# Patient Record
Sex: Male | Born: 1950 | Race: White | Hispanic: No | Marital: Married | Smoking: Never smoker
Health system: Southern US, Community
[De-identification: ages and names within clinical notes are randomized; demographics above are authoritative.]

## PROBLEM LIST (undated history)

## (undated) DIAGNOSIS — E119 Type 2 diabetes mellitus without complications: Secondary | ICD-10-CM

## (undated) DIAGNOSIS — I10 Essential (primary) hypertension: Secondary | ICD-10-CM

## (undated) DIAGNOSIS — E663 Overweight: Secondary | ICD-10-CM

## (undated) DIAGNOSIS — R011 Cardiac murmur, unspecified: Secondary | ICD-10-CM

## (undated) DIAGNOSIS — IMO0002 Reserved for concepts with insufficient information to code with codable children: Secondary | ICD-10-CM

## (undated) DIAGNOSIS — E785 Hyperlipidemia, unspecified: Secondary | ICD-10-CM

## (undated) DIAGNOSIS — G629 Polyneuropathy, unspecified: Secondary | ICD-10-CM

## (undated) DIAGNOSIS — R9430 Abnormal result of cardiovascular function study, unspecified: Secondary | ICD-10-CM

## (undated) DIAGNOSIS — E1140 Type 2 diabetes mellitus with diabetic neuropathy, unspecified: Secondary | ICD-10-CM

## (undated) DIAGNOSIS — I4891 Unspecified atrial fibrillation: Secondary | ICD-10-CM

## (undated) HISTORY — DX: Unspecified atrial fibrillation: I48.91

## (undated) HISTORY — DX: Hyperlipidemia, unspecified: E78.5

## (undated) HISTORY — DX: Cardiac murmur, unspecified: R01.1

## (undated) HISTORY — DX: Abnormal result of cardiovascular function study, unspecified: R94.30

## (undated) HISTORY — DX: Reserved for concepts with insufficient information to code with codable children: IMO0002

## (undated) HISTORY — PX: BACK SURGERY: SHX140

## (undated) HISTORY — DX: Overweight: E66.3

## (undated) HISTORY — DX: Essential (primary) hypertension: I10

---

## 2009-08-03 HISTORY — PX: DECOMPRESSIVE LUMBAR LAMINECTOMY LEVEL 2: SHX5792

## 2009-08-20 ENCOUNTER — Ambulatory Visit (HOSPITAL_COMMUNITY): Admission: RE | Admit: 2009-08-20 | Discharge: 2009-08-21 | Payer: Self-pay | Admitting: Neurosurgery

## 2010-03-19 LAB — CBC
HCT: 46.5 % (ref 39.0–52.0)
MCHC: 35.5 g/dL (ref 30.0–36.0)
MCV: 89.4 fL (ref 78.0–100.0)
WBC: 8.7 10*3/uL (ref 4.0–10.5)

## 2010-03-19 LAB — GLUCOSE, CAPILLARY
Glucose-Capillary: 178 mg/dL — ABNORMAL HIGH (ref 70–99)
Glucose-Capillary: 242 mg/dL — ABNORMAL HIGH (ref 70–99)

## 2010-03-19 LAB — SURGICAL PCR SCREEN: MRSA, PCR: NEGATIVE

## 2010-03-19 LAB — BASIC METABOLIC PANEL
Creatinine, Ser: 1.1 mg/dL (ref 0.4–1.5)
GFR calc Af Amer: >60 mL/min (ref >60)
GFR calc non Af Amer: >60 mL/min (ref >60)
Glucose, Bld: 190 mg/dL — ABNORMAL HIGH (ref 70–99)
Potassium: 3.5 mEq/L (ref 3.5–5.1)

## 2010-03-19 LAB — URINALYSIS, ROUTINE W REFLEX MICROSCOPIC
Ketones, ur: NEGATIVE mg/dL
Protein, ur: NEGATIVE mg/dL
Specific Gravity, Urine: 1.034 — ABNORMAL HIGH (ref 1.005–1.030)
pH: 5 (ref 5.0–8.0)

## 2010-03-19 LAB — PROTIME-INR: INR: 0.91 (ref 0.00–1.49)

## 2012-05-13 ENCOUNTER — Encounter: Payer: Self-pay | Admitting: Cardiology

## 2012-05-25 ENCOUNTER — Encounter: Payer: Self-pay | Admitting: Cardiology

## 2012-05-25 ENCOUNTER — Ambulatory Visit (INDEPENDENT_AMBULATORY_CARE_PROVIDER_SITE_OTHER): Payer: BC Managed Care – PPO | Admitting: Cardiology

## 2012-05-25 VITALS — BP 134/89 | HR 114 | Ht 72.0 in | Wt 246.0 lb

## 2012-05-25 DIAGNOSIS — E119 Type 2 diabetes mellitus without complications: Secondary | ICD-10-CM | POA: Insufficient documentation

## 2012-05-25 DIAGNOSIS — I10 Essential (primary) hypertension: Secondary | ICD-10-CM

## 2012-05-25 DIAGNOSIS — E785 Hyperlipidemia, unspecified: Secondary | ICD-10-CM | POA: Insufficient documentation

## 2012-05-25 DIAGNOSIS — I4891 Unspecified atrial fibrillation: Secondary | ICD-10-CM | POA: Insufficient documentation

## 2012-05-25 DIAGNOSIS — E663 Overweight: Secondary | ICD-10-CM | POA: Insufficient documentation

## 2012-05-25 DIAGNOSIS — I441 Atrioventricular block, second degree: Secondary | ICD-10-CM

## 2012-05-25 MED ORDER — DILTIAZEM HCL ER COATED BEADS 240 MG PO CP24: 240.0000 mg | ORAL_CAPSULE | Freq: Every day | ORAL | Status: DC

## 2012-05-25 NOTE — Assessment & Plan Note (Signed)
Blood pressure is controlled. He had noted swelling with 10 mg of amlodipine. He's currently on 5 mg. I will be switching him to diltiazem to help continue to control his blood pressure but also help control his heart rate.

## 2012-05-25 NOTE — Assessment & Plan Note (Signed)
Diabetes is being treated well. No change in therapy.

## 2012-05-25 NOTE — Patient Instructions (Signed)
   Stop Amlodipine  Begin Diltiazem CD 240mg  daily - new sent to pharm  Lab for TSH (thryoid)  Echo  Office will contact with results Continue all other current medications. Follow up in  June 6

## 2012-05-25 NOTE — Assessment & Plan Note (Addendum)
I had a long and complete discussion with the patient about his atrial fibrillation. He understands fully why he needs to be anticoagulated. I explained that we need a better heart rate control. He is already on high-dose metoprolol. Amlodipine will be changed to diltiazem 240 mg For blood pressure control and better rate control. Am hopeful that he will not get edema from this dose of diltiazem. TSH will be checked. Patient will have 2-D echo to rule out occult valvular heart disease. This will be done next week when his heart rate is under better control. I will then see him back for followup on June 6. If he remains in atrial fibrillation we will proceed with outpatient cardioversion soon after that.  I had an extensive discussion with the patient concerning atrial fibrillation in the approach will be taking.

## 2012-05-25 NOTE — Progress Notes (Signed)
HPI  The patient is referred for new patient cardiac evaluation for atrial fibrillation. I have records from Dr. Dian Situ office stating that the patient had atrial fibrillation that was noted during an office visit on May 17, 2012. The patient was not aware of his atrial fib at that time. We really do not know how long he has had this. He says that he may not have been as active during the winter. He does not have any marked palpitations. His atrial fibrillation rate is elevated. He was started on Eliquis on May 16. I have labs that were done recently. His renal function is normal. I do not see a TSH.  The patient does have risk factors including diabetes and hypertension.  No Known Allergies  Current Outpatient Prescriptions  Medication Sig Dispense Refill  . amLODipine (NORVASC) 10 MG tablet Take 10 mg by mouth daily.      Marland Kitchen apixaban (ELIQUIS) 5 MG TABS tablet Take 5 mg by mouth 2 (two) times daily.      Marland Kitchen aspirin 81 MG tablet Take 81 mg by mouth daily. On hold as of  May 25, 2012      . atorvastatin (LIPITOR) 10 MG tablet Take by mouth. 0.5 Tablets 3 days a week      . lisinopril-hydrochlorothiazide (PRINZIDE,ZESTORETIC) 20-12.5 MG per tablet Take 1 tablet by mouth daily.      . metFORMIN (GLUCOPHAGE) 500 MG tablet Take 500 mg by mouth 3 (three) times daily.      . metoprolol (TOPROL-XL) 200 MG 24 hr tablet Take 200 mg by mouth daily.      Marland Kitchen omeprazole (PRILOSEC) 20 MG capsule Take 20 mg by mouth daily.      Marland Kitchen zolpidem (AMBIEN) 10 MG tablet Take 10 mg by mouth at bedtime as needed for sleep.      . cyclobenzaprine (FLEXERIL) 10 MG tablet Take 10 mg by mouth as needed for muscle spasms.       No current facility-administered medications for this visit.    History   Social History  . Marital Status: Married    Spouse Name: N/A    Number of Children: N/A  . Years of Education: N/A   Occupational History  . Not on file.   Social History Main Topics  . Smoking status: Never  Smoker   . Smokeless tobacco: Not on file  . Alcohol Use: Not on file  . Drug Use: Not on file  . Sexually Active: Not on file   Other Topics Concern  . Not on file   Social History Narrative  . No narrative on file    No family history on file.  Past Medical History  Diagnosis Date  . Diabetes mellitus   . Dyslipidemia   . Hypertension   . Overweight   . Atrial fibrillation   . Heart murmur     As a Child  . AV block, 2nd degree     February, 2014    Past Surgical History  Procedure Laterality Date  . Back surgery  2011/08    Patient Active Problem List   Diagnosis Date Noted  . Diabetes mellitus   . Dyslipidemia   . Hypertension   . Overweight   . Atrial fibrillation   . AV block, 2nd degree     ROS   Patient denies fever, chills, headache, sweats, rash, change in vision, change in hearing, chest pain, cough, nausea vomiting, urinary symptoms. All other systems are reviewed and  are negative.  PHYSICAL EXAM  Patient is oriented to person time and place. Affect is normal. There is no jugulovenous distention. Lungs are clear. Respiratory effort is nonlabored. Cardiac exam reveals S1 and S2. The rhythm is irregularly irregular. There no significant murmurs. The abdomen is soft. There is no peripheral edema. There no musculoskeletal deformities. There no skin rashes.  Filed Vitals:   05/25/12 1251  BP: 134/89  Pulse: 114  Height: 6' (1.829 m)  Weight: 246 lb (111.585 kg)    EKG is done today and reviewed by me. He has atrial fibrillation. The rate is rapid. The QRS is normal. Compared to his outpatient recent tracing and there is no change. ASSESSMENT & PLAN

## 2012-05-31 ENCOUNTER — Other Ambulatory Visit (INDEPENDENT_AMBULATORY_CARE_PROVIDER_SITE_OTHER): Payer: BC Managed Care – PPO

## 2012-05-31 ENCOUNTER — Other Ambulatory Visit: Payer: Self-pay

## 2012-05-31 DIAGNOSIS — I472 Ventricular tachycardia: Secondary | ICD-10-CM

## 2012-05-31 DIAGNOSIS — I4891 Unspecified atrial fibrillation: Secondary | ICD-10-CM

## 2012-06-01 ENCOUNTER — Encounter: Payer: Self-pay | Admitting: Cardiology

## 2012-06-01 DIAGNOSIS — R9430 Abnormal result of cardiovascular function study, unspecified: Secondary | ICD-10-CM | POA: Insufficient documentation

## 2012-06-05 ENCOUNTER — Encounter: Payer: Self-pay | Admitting: *Deleted

## 2012-06-05 ENCOUNTER — Telehealth: Payer: Self-pay | Admitting: *Deleted

## 2012-06-05 NOTE — Telephone Encounter (Signed)
Patient informed. 

## 2012-06-05 NOTE — Telephone Encounter (Signed)
Message copied by Eustace Moore on Tue Jun 05, 2012 11:18 AM ------      Message from: Myrtis Ser, [place] D      Created: Fri Jun 01, 2012  2:51 PM       Please let the patient know that the echo result is good. ------

## 2012-06-08 ENCOUNTER — Ambulatory Visit (INDEPENDENT_AMBULATORY_CARE_PROVIDER_SITE_OTHER): Payer: BC Managed Care – PPO | Admitting: Cardiology

## 2012-06-08 ENCOUNTER — Encounter: Payer: Self-pay | Admitting: Cardiology

## 2012-06-08 VITALS — BP 145/86 | HR 86 | Ht 72.0 in | Wt 249.0 lb

## 2012-06-08 DIAGNOSIS — I10 Essential (primary) hypertension: Secondary | ICD-10-CM

## 2012-06-08 DIAGNOSIS — I4891 Unspecified atrial fibrillation: Secondary | ICD-10-CM

## 2012-06-08 DIAGNOSIS — R0989 Other specified symptoms and signs involving the circulatory and respiratory systems: Secondary | ICD-10-CM

## 2012-06-08 DIAGNOSIS — R9430 Abnormal result of cardiovascular function study, unspecified: Secondary | ICD-10-CM

## 2012-06-08 NOTE — Patient Instructions (Addendum)
   Please contact our office on Monday with your plans about getting a cardioversion. Your physician recommends that you continue on your current medications as directed. Please refer to the Current Medication list given to you today.

## 2012-06-08 NOTE — Assessment & Plan Note (Addendum)
He has persistent atrial fibrillation. The rate is controlled. He is carefully anticoagulated. I have recommended cardioversion. He now says he first wants to talk to his wife and brother. He then wants to decide if he once the procedure at [facility] ordered [place]. He will let us know more information on Monday, June 9.  I had a very extensive discussion with the patient. I reviewed with him the rationale for proceeding with cardioversion. I talked with him about other drugs. I explained to him that he was not a candidate at this time for atrial fibrillation. I talked with him about risks of the procedure. I talked with him about  The fact that I was comfortable with [facility] hospital or [place] for his procedure.  As part of this evaluation I spent greater than 25 minutes with the patient today. More than half of this time was spent with direct discussion with him about the procedure.

## 2012-06-08 NOTE — Assessment & Plan Note (Signed)
Blood pressures controlled. No change in therapy. 

## 2012-06-08 NOTE — Progress Notes (Signed)
HPI   The patient is seen for followup atrial fibrillation. I saw him as a new patient on May 25, 2012. He had recently diagnosed atrial fibrillation. He does not since his atrial fibrillation. We really do not know how long he has had atrial fibrillation. He has been on Eliquis since May 17, 2012. I switched his amlodipine to diltiazem. He has better rate control. Two-dimensional echo was done revealing an ejection fraction 55-60% with no significant valvular abnormalities. He is now back for followup.  No Known Allergies  Current Outpatient Prescriptions  Medication Sig Dispense Refill  . apixaban (ELIQUIS) 5 MG TABS tablet Take 5 mg by mouth 2 (two) times daily.      Marland Kitchen aspirin 81 MG tablet Take 81 mg by mouth daily. On hold as of  May 25, 2012      . atorvastatin (LIPITOR) 10 MG tablet Take by mouth. 0.5 Tablets 3 days a week      . cyclobenzaprine (FLEXERIL) 10 MG tablet Take 10 mg by mouth as needed for muscle spasms.      Marland Kitchen diltiazem (CARDIZEM CD) 240 MG 24 hr capsule Take 1 capsule (240 mg total) by mouth daily.  30 capsule  6  . lisinopril-hydrochlorothiazide (PRINZIDE,ZESTORETIC) 20-12.5 MG per tablet Take 1 tablet by mouth daily.      . metFORMIN (GLUCOPHAGE) 500 MG tablet Take 500 mg by mouth 3 (three) times daily.      . metoprolol (TOPROL-XL) 200 MG 24 hr tablet Take 200 mg by mouth daily.      Marland Kitchen omeprazole (PRILOSEC) 20 MG capsule Take 20 mg by mouth daily.      Marland Kitchen zolpidem (AMBIEN) 10 MG tablet Take 10 mg by mouth at bedtime as needed for sleep.       No current facility-administered medications for this visit.    History   Social History  . Marital Status: Married    Spouse Name: N/A    Number of Children: N/A  . Years of Education: N/A   Occupational History  . Not on file.   Social History Main Topics  . Smoking status: Never Smoker   . Smokeless tobacco: Never Used  . Alcohol Use: Not on file  . Drug Use: Not on file  . Sexually Active: Not on file   Other  Topics Concern  . Not on file   Social History Narrative  . No narrative on file    No family history on file.  Past Medical History  Diagnosis Date  . Diabetes mellitus   . Dyslipidemia   . Hypertension   . Overweight(278.02)   . Atrial fibrillation   . Heart murmur     As a Child  . Ejection fraction     EF 55-60%, echo, May, 2014    Past Surgical History  Procedure Laterality Date  . Back surgery  2011/08    Patient Active Problem List   Diagnosis Date Noted  . Ejection fraction   . Diabetes mellitus   . Dyslipidemia   . Hypertension   . Overweight(278.02)   . Atrial fibrillation     ROS   Patient denies fever, chills, headache, sweats, rash, change in vision, change in hearing, chest pain, cough, nausea or vomiting, urinary symptoms. All other systems are reviewed and are negative.  PHYSICAL EXAM  Patient is oriented to person time and place. Affect is normal. Lungs are clear. Respiratory effort is nonlabored. Cardiac exam reveals S1 and S2. There no  clicks or significant murmurs. The abdomen is soft. There is no peripheral edema.  Filed Vitals:   06/08/12 1457  BP: 145/86  Pulse: 86  Height: 6' (1.829 m)  Weight: 249 lb (112.946 kg)   EKG is done today and reviewed by me. He continues to have atrial fibrillation. This is coarse atrial fibrillation. At times atrial flutter must be considered.  ASSESSMENT & PLAN

## 2012-06-08 NOTE — Assessment & Plan Note (Signed)
His echo shows a normal ejection fraction and no significant valvular abnormalities. There was mild dilatation of the left atrium.

## 2012-06-11 ENCOUNTER — Telehealth: Payer: Self-pay | Admitting: *Deleted

## 2012-06-11 ENCOUNTER — Encounter: Payer: Self-pay | Admitting: *Deleted

## 2012-06-11 ENCOUNTER — Telehealth: Payer: Self-pay | Admitting: Cardiology

## 2012-06-11 ENCOUNTER — Other Ambulatory Visit: Payer: Self-pay | Admitting: *Deleted

## 2012-06-11 DIAGNOSIS — I4891 Unspecified atrial fibrillation: Secondary | ICD-10-CM

## 2012-06-11 NOTE — Telephone Encounter (Signed)
DCCV SCHEDULED FOR Thursday June 14, 2012 AT 9:00 AM WITH DR. KATZ PATIENT TO ARRIVE AT 7:00 AM [facility]

## 2012-06-11 NOTE — Telephone Encounter (Signed)
Patient informed of date and time for DCCV.

## 2012-06-11 NOTE — Telephone Encounter (Signed)
No precert required 

## 2012-06-11 NOTE — Addendum Note (Signed)
Addended by: Eustace Moore on: 06/11/2012 04:00 PM   Modules accepted: Orders

## 2012-06-14 ENCOUNTER — Encounter: Payer: Self-pay | Admitting: Cardiology

## 2012-06-19 ENCOUNTER — Encounter: Payer: Self-pay | Admitting: Cardiology

## 2012-06-19 NOTE — Progress Notes (Signed)
   After I saw the patient on June 08, 2012 we arrange for an outpatient cardioversion. He came to [facility]. When he arrived he was in sinus rhythm. I had a careful discussion with him and his wife. He was discharged home. I asked him to remain on Xarelto. We will see him back in the office for followup. I will then decide about the longevity of the Xarelto therapy.  Jerral Bonito, MD

## 2012-08-06 ENCOUNTER — Encounter: Payer: Self-pay | Admitting: Cardiology

## 2012-08-08 ENCOUNTER — Encounter: Payer: Self-pay | Admitting: Cardiology

## 2012-08-08 ENCOUNTER — Encounter: Payer: Self-pay | Admitting: *Deleted

## 2012-08-08 ENCOUNTER — Telehealth: Payer: Self-pay | Admitting: Cardiology

## 2012-08-08 ENCOUNTER — Ambulatory Visit (INDEPENDENT_AMBULATORY_CARE_PROVIDER_SITE_OTHER): Payer: BC Managed Care – PPO | Admitting: Cardiology

## 2012-08-08 VITALS — BP 136/95 | HR 108 | Ht 72.0 in | Wt 243.0 lb

## 2012-08-08 DIAGNOSIS — I10 Essential (primary) hypertension: Secondary | ICD-10-CM

## 2012-08-08 DIAGNOSIS — Z5181 Encounter for therapeutic drug level monitoring: Secondary | ICD-10-CM

## 2012-08-08 DIAGNOSIS — I25810 Atherosclerosis of coronary artery bypass graft(s) without angina pectoris: Secondary | ICD-10-CM

## 2012-08-08 DIAGNOSIS — I4891 Unspecified atrial fibrillation: Secondary | ICD-10-CM

## 2012-08-08 DIAGNOSIS — Z79899 Other long term (current) drug therapy: Secondary | ICD-10-CM

## 2012-08-08 NOTE — Telephone Encounter (Signed)
Nathan Sutton is concerned about a dental cleaning scheduled for tomorrow. He wants to know will it be ok for dental cleaning And taking the medication Eliquis 5 mg.

## 2012-08-08 NOTE — Telephone Encounter (Signed)
Auth # [id number] exp 09/06/12

## 2012-08-08 NOTE — Assessment & Plan Note (Signed)
Blood pressures control today. He is anxious. No change in therapy.

## 2012-08-08 NOTE — Assessment & Plan Note (Signed)
The patient has return of atrial fibrillation. He does not feel it. It is clear that he goes in and out of atrial fibrillation. He continues on Eliquis (previously I had written Xarelto in the chart).  I want to try to get him back into sinus rhythm long-term. I feel he is a good candidate for flecainide therapy. I know that he is normal left trigger function. He has not had any type of stress testing however. Pharmacologic nuclear study will be done. Assuming this is normal I will start him on flecainide and see him for early followup.  As part of today's evaluation I spent greater than 25 minutes with the patient's overall care. More than half of this time was spent with direct contact with him. I had a long and careful discussion with him about the rationale of the studies to be done for his atrial fibrillation and the plan.

## 2012-08-08 NOTE — Telephone Encounter (Signed)
lexiscan myoview scheduled for 08-15-12 @ [employer] office. Checking percert

## 2012-08-08 NOTE — Progress Notes (Signed)
HPI  The patient is seen back to followup atrial fibrillation. I had first seen him on June 08, 2012 for atrial fibrillation. This was diagnosed by his primary care team and he was already on anticoagulation. Two-dimensional echo showed an ejection fraction 55-60%. There were no focal wall motion abnormalities. There were no significant valvular abnormalities. I continued his anticoagulation and arrange for cardioversion to be done on June 19, 2012. When he arrived he was definitely in sinus rhythm. He does not feel his arrhythmia when he is in atrial fibrillation. I am now seeing him for followup. Anticoagulation continues.  EKG in the office today does show atrial fibrillation. He thought that his rate might be irregular but he did not feel poorly. He does have some anxiety about coming to the office. Heart rate is 95. He's not having any shortness of breath.   Current Outpatient Prescriptions  Medication Sig Dispense Refill  . apixaban (ELIQUIS) 5 MG TABS tablet Take 5 mg by mouth 2 (two) times daily.      Marland Kitchen aspirin 81 MG tablet Take 81 mg by mouth daily. On hold as of  May 25, 2012      . atorvastatin (LIPITOR) 10 MG tablet Take by mouth. 0.5 Tablets 3 days a week      . cyclobenzaprine (FLEXERIL) 10 MG tablet Take 10 mg by mouth as needed for muscle spasms.      Marland Kitchen diltiazem (CARDIZEM CD) 240 MG 24 hr capsule Take 1 capsule (240 mg total) by mouth daily.  30 capsule  6  . lisinopril-hydrochlorothiazide (PRINZIDE,ZESTORETIC) 20-12.5 MG per tablet Take 1 tablet by mouth daily.      . metFORMIN (GLUCOPHAGE) 500 MG tablet Take 500 mg by mouth 3 (three) times daily.      . metoprolol (TOPROL-XL) 200 MG 24 hr tablet Take 200 mg by mouth daily.      Marland Kitchen omeprazole (PRILOSEC) 20 MG capsule Take 20 mg by mouth daily.      Marland Kitchen zolpidem (AMBIEN) 10 MG tablet Take 10 mg by mouth at bedtime as needed for sleep.       No current facility-administered medications for this visit.    History   Social  History  . Marital Status: Married    Spouse Name: N/A    Number of Children: N/A  . Years of Education: N/A   Occupational History  . Not on file.   Social History Main Topics  . Smoking status: Never Smoker   . Smokeless tobacco: Never Used  . Alcohol Use: Not on file  . Drug Use: Not on file  . Sexually Active: Not on file   Other Topics Concern  . Not on file   Social History Narrative  . No narrative on file    No family history on file.  Past Medical History  Diagnosis Date  . Diabetes mellitus   . Dyslipidemia   . Hypertension   . Overweight(278.02)   . Atrial fibrillation   . Heart murmur     As a Child  . Ejection fraction     EF 55-60%, echo, May, 2014    Past Surgical History  Procedure Laterality Date  . Back surgery  2011/08    Patient Active Problem List   Diagnosis Date Noted  . Ejection fraction   . Diabetes mellitus   . Dyslipidemia   . Hypertension   . Overweight(278.02)   . Atrial fibrillation     ROS   patient denies  fever, chills, headache, sweats, rash, change in vision, change in hearing, chest pain, cough, nausea vomiting, urinary symptoms. All other systems are reviewed and are negative.  PHYSICAL EXAM  Patient is stable. He is oriented to person time and place. Affect is normal. There is no jugulovenous distention. Lungs are clear. Respiratory effort is nonlabored. Cardiac exam reveals S1 and S2. There no clicks or significant murmurs. The abdomen is soft. There is no peripheral edema. There no musculoskeletal deformities. There no skin rashes.  Filed Vitals:   08/08/12 1032  BP: 136/95  Pulse: 108  Height: 6' (1.829 m)  Weight: 243 lb (110.224 kg)   EKG is done today and reviewed by me. He has atrial fibrillation with a rate of 95.   ASSESSMENT & PLAN

## 2012-08-08 NOTE — Patient Instructions (Addendum)
Your physician recommends that you schedule a follow-up appointment in: 3-4 weeks. Your physician recommends that you continue on your current medications as directed. Please refer to the Current Medication list given to you today. Your physician has requested that you have a lexiscan myoview. For further information please visit [email or web address] Please follow instruction sheet, as given. Depending on your stress test results, your doctor will decide about you starting flecainide.

## 2012-08-15 ENCOUNTER — Ambulatory Visit (HOSPITAL_COMMUNITY): Payer: BC Managed Care – PPO | Attending: Cardiology | Admitting: Radiology

## 2012-08-15 VITALS — BP 134/94 | Ht 72.0 in | Wt 248.0 lb

## 2012-08-15 DIAGNOSIS — E119 Type 2 diabetes mellitus without complications: Secondary | ICD-10-CM | POA: Insufficient documentation

## 2012-08-15 DIAGNOSIS — I25810 Atherosclerosis of coronary artery bypass graft(s) without angina pectoris: Secondary | ICD-10-CM

## 2012-08-15 DIAGNOSIS — I4892 Unspecified atrial flutter: Secondary | ICD-10-CM | POA: Insufficient documentation

## 2012-08-15 DIAGNOSIS — I10 Essential (primary) hypertension: Secondary | ICD-10-CM | POA: Insufficient documentation

## 2012-08-15 DIAGNOSIS — I4891 Unspecified atrial fibrillation: Secondary | ICD-10-CM | POA: Insufficient documentation

## 2012-08-15 DIAGNOSIS — Z79899 Other long term (current) drug therapy: Secondary | ICD-10-CM

## 2012-08-15 MED ORDER — REGADENOSON 0.4 MG/5ML IV SOLN
0.4000 mg | Freq: Once | INTRAVENOUS | Status: AC
  Administered 2012-08-15: 0.4 mg via INTRAVENOUS

## 2012-08-15 MED ORDER — METOPROLOL TARTRATE 1 MG/ML IV SOLN
5.0000 mg | Freq: Once | INTRAVENOUS | Status: AC
  Administered 2012-08-15: 5 mg via INTRAVENOUS

## 2012-08-15 MED ORDER — TECHNETIUM TC 99M SESTAMIBI GENERIC - CARDIOLITE
11.0000 | Freq: Once | INTRAVENOUS | Status: AC | PRN
  Administered 2012-08-15: 11 via INTRAVENOUS

## 2012-08-15 MED ORDER — TECHNETIUM TC 99M SESTAMIBI GENERIC - CARDIOLITE
33.0000 | Freq: Once | INTRAVENOUS | Status: AC | PRN
  Administered 2012-08-15: 33 via INTRAVENOUS

## 2012-08-15 NOTE — Progress Notes (Signed)
[facility] SITE 3 NUCLEAR MED [address] [place], [place] [address] [phone]    Cardiology Nuclear Med Study  Nathan Sutton is a 62 y.o. male     MRN : [id number]     DOB: 1950/08/19  Procedure Date: 08/15/2012  Nuclear Med Background Indication for Stress Test:  Evaluation for Ischemia, evaluation prior to starting Flecainide History:  2014 Newly diagnosed A-fib, Echo EF 55-60% Cardiac Risk Factors: Hypertension, Lipids and NIDDM  Symptoms:  No Cardiac Symptoms   Nuclear Pre-Procedure Caffeine/Decaff Intake:  None NPO After: 11:30pm   Lungs:  clear O2 Sat: 98% on room air. IV 0.9% NS with Angio Cath:  22g  IV Site: R Antecubital  IV Started by:  Bonnita Levan, RN  Chest Size (in):  48 Cup Size: n/a  Height: 6' (1.829 m)  Weight:  248 lb (112.492 kg)  BMI:  Body mass index is 33.63 kg/(m^2). Tech Comments:  Patient held Metformin, Toprol this AM, Heart rate prior to test 140's-150's. 200 mg Toprol po and 5mg  toprol IV given per Dr. Excell Seltzer.    Nuclear Med Study 1 or 2 day study: 1 day  Stress Test Type:  Lexiscan  Reading MD: Charlton Haws, MD  Order Authorizing Provider:  Willa Rough, MD  Resting Radionuclide: Technetium 59m Sestamibi  Resting Radionuclide Dose: 11.0 mCi   Stress Radionuclide:  Technetium 21m Sestamibi  Stress Radionuclide Dose: 33.0 mCi           Stress Protocol Rest HR: 109 Stress HR: 118  Rest BP: 134/94 Stress BP: 163/101  Exercise Time (min): n/a METS: n/a   Predicted Max HR: 159 bpm % Max HR: 78.62 bpm Rate Pressure Product: 72536   Dose of Adenosine (mg):  n/a Dose of Lexiscan: 0.4 mg  Dose of Atropine (mg): n/a Dose of Dobutamine: n/a mcg/kg/min (at max HR)  Stress Test Technologist: Bonnita Levan, RN  Nuclear Technologist:  Domenic Polite, CNMT     Rest Procedure:  Myocardial perfusion imaging was performed at rest 45 minutes following the intravenous administration of Technetium 57m Sestamibi. Rest ECG: Atrial  Fibrilliation  Stress Procedure:  The patient received IV Lexiscan 0.4 mg over 15-seconds.  Technetium 7m Sestamibi injected at 30-seconds.  Quantitative spect images were obtained after a 45 minute delay. Stress ECG: No significant change from baseline ECG  QPS Raw Data Images:  Normal; no motion artifact; normal heart/lung ratio. Stress Images:  Normal homogeneous uptake in all areas of the myocardium. Rest Images:  Normal homogeneous uptake in all areas of the myocardium. Subtraction (SDS):  Normal Transient Ischemic Dilatation (Normal <1.22):  n/a Lung/Heart Ratio (Normal <0.45):  0.45  Quantitative Gated Spect Images QGS EDV:  n/a QGS ESV:  n/a  Impression Exercise Capacity:  Lexiscan with no exercise. BP Response:  Normal blood pressure response. Clinical Symptoms:  No significant symptoms noted. ECG Impression:  No significant ST segment change suggestive of ischemia. Comparison with Prior Nuclear Study: No previous nuclear study performed  Overall Impression:  Normal stress nuclear study. Baseline rhythm rapid fib/flutter  LV Ejection Fraction: Study not gated.  LV Wall Motion:  Study not gated  [employer]

## 2012-08-16 ENCOUNTER — Encounter: Payer: Self-pay | Admitting: Cardiology

## 2012-08-16 ENCOUNTER — Telehealth: Payer: Self-pay | Admitting: *Deleted

## 2012-08-16 DIAGNOSIS — IMO0001 Reserved for inherently not codable concepts without codable children: Secondary | ICD-10-CM | POA: Insufficient documentation

## 2012-08-16 MED ORDER — FLECAINIDE ACETATE 50 MG PO TABS: 50.0000 mg | ORAL_TABLET | Freq: Two times a day (BID) | ORAL | Status: DC

## 2012-08-16 NOTE — Telephone Encounter (Signed)
Message copied by Eustace Moore on Thu Aug 16, 2012  2:49 PM ------      Message from: Myrtis Ser, [place] D      Created: Thu Aug 16, 2012  2:25 PM       Please let the patient know that his nuclear stress test was normal. Please start him on flecainide 50 mg twice a day. Please schedule him to see me in the office 3 weeks after he starts the flecainide ------

## 2012-08-16 NOTE — Telephone Encounter (Signed)
Patient informed. 

## 2012-08-16 NOTE — Progress Notes (Signed)
   Patient has atrial fibrillation. Stress test was done to be sure that he does not have any coronary disease. The nuclear stress test could not be gated. However there was no scar or ischemia, August, 2014. Patient will be started on flecainide 50 mg twice a day.   Jerral Bonito.

## 2012-09-04 ENCOUNTER — Ambulatory Visit: Payer: BC Managed Care – PPO | Admitting: Cardiology

## 2012-09-21 ENCOUNTER — Encounter: Payer: Self-pay | Admitting: Cardiology

## 2012-09-21 ENCOUNTER — Ambulatory Visit (INDEPENDENT_AMBULATORY_CARE_PROVIDER_SITE_OTHER): Payer: BC Managed Care – PPO | Admitting: Cardiology

## 2012-09-21 VITALS — BP 152/92 | HR 79 | Ht 72.0 in | Wt 251.0 lb

## 2012-09-21 DIAGNOSIS — R432 Parageusia: Secondary | ICD-10-CM | POA: Insufficient documentation

## 2012-09-21 DIAGNOSIS — R439 Unspecified disturbances of smell and taste: Secondary | ICD-10-CM

## 2012-09-21 DIAGNOSIS — I4891 Unspecified atrial fibrillation: Secondary | ICD-10-CM

## 2012-09-21 DIAGNOSIS — I10 Essential (primary) hypertension: Secondary | ICD-10-CM

## 2012-09-21 MED ORDER — AMLODIPINE BESYLATE 10 MG PO TABS: 10.0000 mg | ORAL_TABLET | Freq: Every day | ORAL | Status: DC

## 2012-09-21 NOTE — Progress Notes (Signed)
HPI   Patient is seen to followup atrial fibrillation. I made a decision to start flecainide at 50 mg twice a day at the time of his last visit. He is not feeling any palpitations. However we know that he can have atrial fib without feeling it. He says that he is having a sensation of change in taste of food. He also feels he may be having some mood swings. These are not common problems related to this drug. He is on extremely small dose.  He's not having any chest pain or shortness of breath.  No Known Allergies  Current Outpatient Prescriptions  Medication Sig Dispense Refill  . apixaban (ELIQUIS) 5 MG TABS tablet Take 5 mg by mouth 2 (two) times daily.      Marland Kitchen atorvastatin (LIPITOR) 10 MG tablet Take by mouth. 0.5 Tablets 3 days a week      . cyclobenzaprine (FLEXERIL) 10 MG tablet Take 10 mg by mouth as needed for muscle spasms.      Marland Kitchen diltiazem (CARDIZEM CD) 240 MG 24 hr capsule Take 1 capsule (240 mg total) by mouth daily.  30 capsule  6  . flecainide (TAMBOCOR) 50 MG tablet Take 1 tablet (50 mg total) by mouth 2 (two) times daily.  180 tablet  3  . lisinopril-hydrochlorothiazide (PRINZIDE,ZESTORETIC) 20-12.5 MG per tablet Take 2 tablets by mouth daily.       . metFORMIN (GLUCOPHAGE) 500 MG tablet Take 500 mg by mouth 3 (three) times daily.      . metoprolol (TOPROL-XL) 200 MG 24 hr tablet Take 200 mg by mouth daily.      Marland Kitchen omeprazole (PRILOSEC) 20 MG capsule Take 20 mg by mouth daily.      . traZODone (DESYREL) 100 MG tablet Take 100 mg by mouth at bedtime as needed for sleep.      Marland Kitchen zolpidem (AMBIEN) 10 MG tablet Take 10 mg by mouth at bedtime as needed for sleep.      Marland Kitchen aspirin 81 MG tablet Take 81 mg by mouth daily. On hold as of  May 25, 2012       No current facility-administered medications for this visit.    History   Social History  . Marital Status: Married    Spouse Name: N/A    Number of Children: N/A  . Years of Education: N/A   Occupational History  . Not on  file.   Social History Main Topics  . Smoking status: Never Smoker   . Smokeless tobacco: Never Used  . Alcohol Use: Not on file  . Drug Use: Not on file  . Sexual Activity: Not on file   Other Topics Concern  . Not on file   Social History Narrative  . No narrative on file    No family history on file.  Past Medical History  Diagnosis Date  . Diabetes mellitus   . Dyslipidemia   . Hypertension   . Overweight(278.02)   . Atrial fibrillation   . Heart murmur     As a Child  . Ejection fraction     EF 55-60%, echo, May, 2014    Past Surgical History  Procedure Laterality Date  . Back surgery  2011/08    Patient Active Problem List   Diagnosis Date Noted  . Normal cardiac stress test 08/16/2012  . Ejection fraction   . Diabetes mellitus   . Dyslipidemia   . Hypertension   . Overweight(278.02)   . Atrial fibrillation  ROS   Patient denies fever, chills, headache, sweats, rash, change in vision, change in hearing, chest pain, cough, nausea vomiting, urinary symptoms. All other systems are reviewed and are negative  PHYSICAL EXAM  Patient is stable today. He is oriented to person time and place. Affect is normal. There is no jugulovenous distention. Lungs are clear. Respiratory effort is nonlabored. Cardiac exam reveals S1 and S2. There no clicks or significant murmurs. The rhythm is regular. Abdomen is soft. There is no peripheral edema. There no musculoskeletal deformities. There are no skin rashes. Filed Vitals:   09/21/12 0824  BP: 152/92  Pulse: 79  Height: 6' (1.829 m)  Weight: 251 lb (113.853 kg)    ASSESSMENT & PLAN

## 2012-09-21 NOTE — Patient Instructions (Signed)
   Stop Diltiazem  Change back to Amlodipine 10mg  daily (already has at home, will call if need refill) Continue all other medications.   Follow up in  4-6 weeks

## 2012-09-21 NOTE — Assessment & Plan Note (Signed)
The patient and I had a long discussion about multiple issues. He thinks he's had some change in taste. This is not listed is a common side effect from flecainide. He also has had some mood swings. He is on other medications for this type of problem. At this point I do not think it is his flecainide.  He is bothered by the heat. This is not a new problem.  He asked me what his overall diagnosis is. I explained to him that he has atrial fibrillation but that he is healthy overall. He should go back to full activities.  As part of today's evaluation I spent greater than 25 minutes with his total care. More than half of this time was spent with the rectal contact with the patient discussing multiple issues.

## 2012-09-21 NOTE — Assessment & Plan Note (Signed)
Patient's blood pressure is running on the higher side. I had switched him from amlodipine to diltiazem when he had increased heart rate with his atrial fibrillation. His rhythm is controlled on a small dose of flecainide. However change him back to amlodipine. Hopefully he'll have better blood pressure control with this.

## 2012-09-21 NOTE — Assessment & Plan Note (Signed)
The patient is now on very low-dose flecainide. I do not think he is having significant side effects from the drug. He will be continued very low dose. I will see him back for early followup.

## 2012-10-29 ENCOUNTER — Ambulatory Visit (INDEPENDENT_AMBULATORY_CARE_PROVIDER_SITE_OTHER): Payer: BC Managed Care – PPO | Admitting: Cardiology

## 2012-10-29 ENCOUNTER — Encounter: Payer: Self-pay | Admitting: Cardiology

## 2012-10-29 VITALS — BP 140/92 | HR 114 | Ht 72.0 in | Wt 252.0 lb

## 2012-10-29 DIAGNOSIS — I10 Essential (primary) hypertension: Secondary | ICD-10-CM

## 2012-10-29 DIAGNOSIS — I4891 Unspecified atrial fibrillation: Secondary | ICD-10-CM

## 2012-10-29 MED ORDER — DILTIAZEM HCL ER COATED BEADS 240 MG PO CP24: 240.0000 mg | ORAL_CAPSULE | Freq: Every day | ORAL | Status: DC

## 2012-10-29 MED ORDER — FLECAINIDE ACETATE 100 MG PO TABS: 100.0000 mg | ORAL_TABLET | Freq: Two times a day (BID) | ORAL | Status: DC

## 2012-10-29 NOTE — Progress Notes (Signed)
HPI  Patient returns today for followup of his atrial fibrillation. He's also seen for hypertension. At the time of his last visit I had switched his diltiazem to amlodipine for better blood pressure control. He returns today with atrial fibrillation. He has very mild symptoms. He is anticoagulated.  He continues to have problems with sleeping. He is using Ambien and he may have some amnestic affects with some sleepwalking. No Known Allergies  Current Outpatient Prescriptions  Medication Sig Dispense Refill  . amLODipine (NORVASC) 10 MG tablet Take 1 tablet (10 mg total) by mouth daily.      Marland Kitchen apixaban (ELIQUIS) 5 MG TABS tablet Take 5 mg by mouth 2 (two) times daily.      Marland Kitchen aspirin 81 MG tablet Take 81 mg by mouth daily. On hold as of  May 25, 2012      . atorvastatin (LIPITOR) 10 MG tablet Take by mouth. 0.5 Tablets 3 days a week      . cyclobenzaprine (FLEXERIL) 10 MG tablet Take 10 mg by mouth as needed for muscle spasms.      . flecainide (TAMBOCOR) 50 MG tablet Take 1 tablet (50 mg total) by mouth 2 (two) times daily.  180 tablet  3  . lisinopril-hydrochlorothiazide (PRINZIDE,ZESTORETIC) 20-12.5 MG per tablet Take 2 tablets by mouth daily.       . metFORMIN (GLUCOPHAGE) 500 MG tablet Take 500 mg by mouth 3 (three) times daily.      . metoprolol (TOPROL-XL) 200 MG 24 hr tablet Take 200 mg by mouth daily.      Marland Kitchen omeprazole (PRILOSEC) 20 MG capsule Take 20 mg by mouth daily.      Marland Kitchen zolpidem (AMBIEN) 10 MG tablet Take 10 mg by mouth at bedtime as needed for sleep.       No current facility-administered medications for this visit.    History   Social History  . Marital Status: Married    Spouse Name: N/A    Number of Children: N/A  . Years of Education: N/A   Occupational History  . Not on file.   Social History Main Topics  . Smoking status: Never Smoker   . Smokeless tobacco: Never Used  . Alcohol Use: Not on file  . Drug Use: Not on file  . Sexual Activity: Not on  file   Other Topics Concern  . Not on file   Social History Narrative  . No narrative on file    No family history on file.  Past Medical History  Diagnosis Date  . Diabetes mellitus   . Dyslipidemia   . Hypertension   . Overweight(278.02)   . Atrial fibrillation   . Heart murmur     As a Child  . Ejection fraction     EF 55-60%, echo, May, 2014    Past Surgical History  Procedure Laterality Date  . Back surgery  2011/08    Patient Active Problem List   Diagnosis Date Noted  . Altered taste 09/21/2012  . Normal cardiac stress test 08/16/2012  . Ejection fraction   . Diabetes mellitus   . Dyslipidemia   . Hypertension   . Overweight(278.02)   . Atrial fibrillation     ROS   Patient denies fever, chills, headache, sweats, rash, change in vision, change in hearing, chest pain, cough, nausea vomiting, urinary symptoms. All other systems are reviewed and are negative other than the history of present illness.  PHYSICAL EXAM  Patient is oriented  to person time and place. Affect is normal. He does become anxious before his visits. There is no jugulovenous distention. Lungs are clear. Respiratory effort is nonlabored. Cardiac exam reveals S1 and S2. There no clicks or significant murmurs. The rhythm is irregularly irregular. The abdomen is soft. There is no peripheral edema. There no musculoskeletal deformities. There are no skin rashes.  Filed Vitals:   10/29/12 0849  BP: 140/92  Pulse: 114  Height: 6' (1.829 m)  Weight: 252 lb (114.306 kg)   EKG is done today and reviewed by me. The QRS is unchanged. There is return of atrial fibrillation/flutter.  ASSESSMENT & PLAN

## 2012-10-29 NOTE — Assessment & Plan Note (Signed)
Blood pressure still mildly elevated. Despite this I will be changing him from diltiazem despite this I will be changing him from amlodipine back to diltiazem for better rate control. We will follow his blood pressure.

## 2012-10-29 NOTE — Patient Instructions (Signed)
Your physician recommends that you schedule a follow-up appointment in: November 09, 2012. Please get the time at the check out today. Your physician has recommended you make the following change in your medication: STOP AMLODIPINE. RE-START DILTIAZEM ER 240 MG DAILY. PLEASE TAKE THIS TODAY AROUND 4:00 PM; THEN START DAILY IN THE MORNING. INCREASE YOUR FLECAINIDE TO 100 MG TWICE DAILY STARTING WITH THE EVENING DOSE ON TOMORROW Tuesday 10/30/12. All other medications will remain the same. Your new prescriptions have been sent to your pharmacy.

## 2012-10-29 NOTE — Assessment & Plan Note (Signed)
The patient now has recurrent atrial fibrillation on 50 mg twice a day of flecainide. I will to switch his amlodipine back to diltiazem for better rate control. Then I will increase his flecainide to 100 twice a day. He is anticoagulated. We'll see him for early followup. No other workup at this time.  As part of today's evaluation I spent greater than 25 minutes with his total care. More than half of this time has been with direct contact with him. I had an extensive discussion with him once again about anticoagulation and his atrial fibrillation and the choice of medicines going forward.

## 2012-11-08 ENCOUNTER — Other Ambulatory Visit: Payer: Self-pay

## 2012-11-09 ENCOUNTER — Encounter: Payer: Self-pay | Admitting: Cardiology

## 2012-11-09 ENCOUNTER — Ambulatory Visit (INDEPENDENT_AMBULATORY_CARE_PROVIDER_SITE_OTHER): Payer: BC Managed Care – PPO | Admitting: Cardiology

## 2012-11-09 VITALS — BP 163/92 | HR 60 | Ht 72.0 in | Wt 254.0 lb

## 2012-11-09 DIAGNOSIS — I4891 Unspecified atrial fibrillation: Secondary | ICD-10-CM

## 2012-11-09 NOTE — Assessment & Plan Note (Addendum)
The patient is anticoagulated. On a higher dose of flecainide he is in sinus rhythm today. The plan will be to check a trough flecainide level and see him back. I will then decide over time about exercise testing. He had a careful discussion about medications for upper respiratory infections. He knows not to use medicines with catecholamines. He will bring in all of his over-the-counter medicines for his next visit.

## 2012-11-09 NOTE — Progress Notes (Signed)
HPI  Patient is seen today to followup his atrial fibrillation. When I saw him last he had more atrial fibrillation. He is anticoagulated. I increase his flecainide from 50 twice a day to 100 twice a day. He said he felt poorly for a few days but then improved. Now he feels good. He does not feel his arrhythmia.  No Known Allergies  Current Outpatient Prescriptions  Medication Sig Dispense Refill  . apixaban (ELIQUIS) 5 MG TABS tablet Take 5 mg by mouth 2 (two) times daily.      Marland Kitchen aspirin 81 MG tablet Take 81 mg by mouth daily. On hold as of  May 25, 2012      . atorvastatin (LIPITOR) 10 MG tablet Take by mouth. 0.5 Tablets 3 days a week      . cyclobenzaprine (FLEXERIL) 10 MG tablet Take 10 mg by mouth as needed for muscle spasms.      Marland Kitchen diltiazem (CARDIZEM CD) 240 MG 24 hr capsule Take 1 capsule (240 mg total) by mouth daily.  90 capsule  3  . flecainide (TAMBOCOR) 100 MG tablet Take 1 tablet (100 mg total) by mouth 2 (two) times daily.  180 tablet  3  . lisinopril-hydrochlorothiazide (PRINZIDE,ZESTORETIC) 20-12.5 MG per tablet Take 2 tablets by mouth daily.       . metFORMIN (GLUCOPHAGE) 500 MG tablet Take 500 mg by mouth 3 (three) times daily.      . metoprolol (TOPROL-XL) 200 MG 24 hr tablet Take 200 mg by mouth daily.      Marland Kitchen omeprazole (PRILOSEC) 20 MG capsule Take 20 mg by mouth daily.      Marland Kitchen zolpidem (AMBIEN) 10 MG tablet Take 10 mg by mouth at bedtime as needed for sleep.       No current facility-administered medications for this visit.    History   Social History  . Marital Status: Married    Spouse Name: N/A    Number of Children: N/A  . Years of Education: N/A   Occupational History  . Not on file.   Social History Main Topics  . Smoking status: Never Smoker   . Smokeless tobacco: Never Used  . Alcohol Use: Not on file  . Drug Use: Not on file  . Sexual Activity: Not on file   Other Topics Concern  . Not on file   Social History Narrative  . No narrative  on file    No family history on file.  Past Medical History  Diagnosis Date  . Diabetes mellitus   . Dyslipidemia   . Hypertension   . Overweight(278.02)   . Atrial fibrillation   . Heart murmur     As a Child  . Ejection fraction     EF 55-60%, echo, May, 2014    Past Surgical History  Procedure Laterality Date  . Back surgery  2011/08    Patient Active Problem List   Diagnosis Date Noted  . Altered taste 09/21/2012  . Normal cardiac stress test 08/16/2012  . Ejection fraction   . Diabetes mellitus   . Dyslipidemia   . Hypertension   . Overweight(278.02)   . Atrial fibrillation     ROS   Patient denies fever, chills, headache, sweats, rash, change in vision, change in hearing, chest pain, cough, nausea or vomiting, urinary symptoms. All other systems are reviewed and are negative.  PHYSICAL EXAM  Patient is oriented to person time and place. Affect is normal. He is overweight. There  is no jugulovenous distention. Lungs are clear. Respiratory effort is nonlabored. Cardiac exam reveals S1 and S2. There no clicks or significant murmurs. At times there is a bigeminal rhythm. There is no peripheral edema. There no musculoskeletal deformities.  Filed Vitals:   11/09/12 0919  BP: 163/92  Pulse: 60  Height: 6' (1.829 m)  Weight: 254 lb (115.214 kg)   EKG is done today and reviewed by me. There is sinus rhythm. There are PACs.  ASSESSMENT & PLAN

## 2012-11-09 NOTE — Assessment & Plan Note (Signed)
The patient becomes quite anxious when he comes into the office. His pressure is high normal today. I feel we should not make any changes today.

## 2012-11-09 NOTE — Patient Instructions (Signed)
Your physician recommends that you schedule a follow-up appointment in: 3 weeks. Your physician recommends that you continue on your current medications as directed. Please refer to the Current Medication list given to you today. Your physician recommends that you have lab work to check your flecainide level. Please have this done in the morning around the time that your next dose is due.

## 2012-11-27 ENCOUNTER — Telehealth: Payer: Self-pay | Admitting: *Deleted

## 2012-11-27 NOTE — Telephone Encounter (Signed)
Message copied by Lesle Chris on Tue Nov 27, 2012  1:31 PM ------      Message from: [place], [place] D      Created: Tue Nov 27, 2012  1:06 PM       Please let him know that the flecainide level drawn November 12 was in a very good range. ------

## 2012-11-27 NOTE — Telephone Encounter (Signed)
Patient notified and verbalized understanding. 

## 2012-12-04 ENCOUNTER — Encounter: Payer: Self-pay | Admitting: Cardiology

## 2012-12-04 ENCOUNTER — Ambulatory Visit (INDEPENDENT_AMBULATORY_CARE_PROVIDER_SITE_OTHER): Payer: BC Managed Care – PPO | Admitting: Cardiology

## 2012-12-04 VITALS — BP 158/97 | HR 85 | Ht 72.0 in | Wt 251.0 lb

## 2012-12-04 DIAGNOSIS — I4891 Unspecified atrial fibrillation: Secondary | ICD-10-CM

## 2012-12-04 DIAGNOSIS — R439 Unspecified disturbances of smell and taste: Secondary | ICD-10-CM

## 2012-12-04 DIAGNOSIS — R432 Parageusia: Secondary | ICD-10-CM

## 2012-12-04 DIAGNOSIS — I10 Essential (primary) hypertension: Secondary | ICD-10-CM

## 2012-12-04 MED ORDER — FLECAINIDE ACETATE 150 MG PO TABS: 150.0000 mg | ORAL_TABLET | Freq: Two times a day (BID) | ORAL | Status: DC

## 2012-12-04 NOTE — Progress Notes (Signed)
HPI  And and and patient is seen today to followup atrial fibrillation. Flecainide level on 100 mg twice a day was 0.27. This is the lower end of the therapeutic range. The patient is here today feeling relatively well. However his EKG shows that he is back in coarse atrial fibrillation. It is possible that this could be called flutter. However it does not look like flutter in all leads. He does not sense the arrhythmia. However he does have periods of fatigue. At this point we have to assume that he does have some symptoms from his arrhythmia. He is anticoagulated.  No Known Allergies  Current Outpatient Prescriptions  Medication Sig Dispense Refill  . apixaban (ELIQUIS) 5 MG TABS tablet Take 5 mg by mouth 2 (two) times daily.      Marland Kitchen aspirin 81 MG tablet Take 81 mg by mouth daily. On hold as of  May 25, 2012      . atorvastatin (LIPITOR) 10 MG tablet Take by mouth. 0.5 Tablets 3 days a week      . cyclobenzaprine (FLEXERIL) 10 MG tablet Take 10 mg by mouth as needed for muscle spasms.      Marland Kitchen diltiazem (CARDIZEM CD) 240 MG 24 hr capsule Take 1 capsule (240 mg total) by mouth daily.  90 capsule  3  . flecainide (TAMBOCOR) 100 MG tablet Take 1 tablet (100 mg total) by mouth 2 (two) times daily.  180 tablet  3  . lisinopril-hydrochlorothiazide (PRINZIDE,ZESTORETIC) 20-12.5 MG per tablet Take 2 tablets by mouth daily.       . metFORMIN (GLUCOPHAGE) 500 MG tablet Take 500 mg by mouth 3 (three) times daily.      . metoprolol (TOPROL-XL) 200 MG 24 hr tablet Take 200 mg by mouth daily.      Marland Kitchen omeprazole (PRILOSEC) 20 MG capsule Take 20 mg by mouth daily.      Marland Kitchen zolpidem (AMBIEN) 10 MG tablet Take 10 mg by mouth at bedtime as needed for sleep.       No current facility-administered medications for this visit.    History   Social History  . Marital Status: Married    Spouse Name: N/A    Number of Children: N/A  . Years of Education: N/A   Occupational History  . Not on file.   Social  History Main Topics  . Smoking status: Never Smoker   . Smokeless tobacco: Never Used  . Alcohol Use: Not on file  . Drug Use: Not on file  . Sexual Activity: Not on file   Other Topics Concern  . Not on file   Social History Narrative  . No narrative on file    No family history on file.  Past Medical History  Diagnosis Date  . Diabetes mellitus   . Dyslipidemia   . Hypertension   . Overweight(278.02)   . Atrial fibrillation   . Heart murmur     As a Child  . Ejection fraction     EF 55-60%, echo, May, 2014    Past Surgical History  Procedure Laterality Date  . Back surgery  2011/08    Patient Active Problem List   Diagnosis Date Noted  . Altered taste 09/21/2012  . Normal cardiac stress test 08/16/2012  . Ejection fraction   . Diabetes mellitus   . Dyslipidemia   . Hypertension   . Overweight(278.02)   . Atrial fibrillation     ROS   Patient denies fever, chills, headache,  sweats, rash, change in vision, change in hearing, chest pain, cough, nausea vomiting, urinary symptoms. He has a problem with dry mouth.  PHYSICAL EXAM  Patient is oriented to person time and place. Affect is normal. There is no jugulovenous distention. Lungs are clear. Respiratory effort is nonlabored. Cardiac exam reveals S1 and S2. There no clicks or significant murmurs. The abdomen is soft. There is no peripheral edema. There no musculoskeletal deformities. There are no skin rashes. The rhythm is irregularly irregular.  Filed Vitals:   12/04/12 1514  BP: 158/97  Pulse: 85  Height: 6' (1.829 m)  Weight: 251 lb (113.853 kg)   EKG is done today and reviewed by me. There is atrophic and(or atrial flutter) with a controlled rate. I suspect that this is coarse fibrillation. ASSESSMENT & PLAN

## 2012-12-04 NOTE — Patient Instructions (Signed)
Your physician recommends that you schedule a follow-up appointment in: 3 weeks. Your physician has recommended you make the following change in your medication: Increase your flecainide to 150 mg twice daily. You may take 1&1/2 tablets twice daily of your 100 mg tablets until they are finished. Your new prescription has been sent to your pharmacy. All other medications will remain the same. Your physician recommends that you have lab work in 2 weeks around 12/18/12 to check your trough flecainide level. Please have this done the morning your next dose of flecainide is due.

## 2012-12-04 NOTE — Assessment & Plan Note (Signed)
Patient continues to have mild change in his taste. No further workup.

## 2012-12-04 NOTE — Assessment & Plan Note (Signed)
Blood pressure is elevated today. His pressures are normal at home. He is quite anxious when he is here. I will not be changing his medicine today.

## 2012-12-04 NOTE — Assessment & Plan Note (Signed)
He is now back in atrial fib or atrial flutter. His rate is controlled. Flexion I. level on 100 twice a day was only 0.27. He will be increased to 150 twice a day. I will then see him back. With review of his EKGs I am not sure if this is coarse fibrillation or whether it is atrial flutter. I will ask for review of the strips. If this is atrial flutter, I would be more inclined to encourage flutter ablation sooner rather than later. I did talk with them also about the idea of atrial fibrillation. We had a long discussion about this. We've decided to increase the flecainide dose for now. I will have the strips reviewed by others and we will decide the timing of electrophysiology consultation in a later date.  He continues to be anticoagulated. As part of today's evaluation I spent greater than 25 minutes with is total care. Would have a this time is been with direct contact with him fully discussing the concepts of atrial fibrillation.

## 2012-12-05 ENCOUNTER — Encounter: Payer: Self-pay | Admitting: Cardiology

## 2012-12-05 NOTE — Addendum Note (Signed)
Addended by: Eustace Moore on: 12/05/2012 11:10 AM   Modules accepted: Orders

## 2012-12-05 NOTE — Progress Notes (Signed)
I reviewed a series of EKGs with Dr. Johney Frame. He agrees that this is coarse atrial fib. He agrees that it is not atrial flutter. The plan will be to continue treating with higher dose flecainide. If the patient does not hold sinus rhythm, I will refer him for consideration of atrial fib ablation.    Jerral Bonito, MD

## 2012-12-20 ENCOUNTER — Encounter: Payer: Self-pay | Admitting: Cardiology

## 2012-12-24 ENCOUNTER — Ambulatory Visit (INDEPENDENT_AMBULATORY_CARE_PROVIDER_SITE_OTHER): Payer: BC Managed Care – PPO | Admitting: Cardiology

## 2012-12-24 ENCOUNTER — Encounter: Payer: Self-pay | Admitting: Cardiology

## 2012-12-24 VITALS — BP 162/108 | HR 111 | Ht 72.0 in | Wt 252.0 lb

## 2012-12-24 DIAGNOSIS — I10 Essential (primary) hypertension: Secondary | ICD-10-CM

## 2012-12-24 DIAGNOSIS — I4891 Unspecified atrial fibrillation: Secondary | ICD-10-CM

## 2012-12-24 MED ORDER — DILTIAZEM HCL ER COATED BEADS 240 MG PO CP24: 240.0000 mg | ORAL_CAPSULE | Freq: Two times a day (BID) | ORAL | Status: DC

## 2012-12-24 NOTE — Progress Notes (Signed)
HPI  Patient is seen today to followup his atrial fibrillation. I saw him last December 05, 2012. At that time decision was made to push his flecainide up to 150 mg twice a day. I wanted a followup flecainide level. This was drawn on December 17 but unfortunately has not yet available. It has been difficult to decide if he is symptomatic from his atrial fib or not.  I reviewed history seems with Dr. Johney Frame since the patient's last visit. Dr. Johney Frame feels that this is coarse atrial fibrillation. He feels that it is not atrial flutter. From our discussion he feels that the patient would probably be a good candidate for atrial fibrillation ablation if this appears to be appropriate over time.  Patient returns today feeling relatively well. His EKG does show atrial fibrillation. The rate today is a little more elevated than we usually see for him. He still does not have any significant symptoms.  No Known Allergies  Current Outpatient Prescriptions  Medication Sig Dispense Refill  . apixaban (ELIQUIS) 5 MG TABS tablet Take 5 mg by mouth 2 (two) times daily.      Marland Kitchen aspirin 81 MG tablet Take 81 mg by mouth daily. On hold as of  May 25, 2012      . atorvastatin (LIPITOR) 10 MG tablet Take by mouth. 0.5 Tablets 3 days a week      . cyclobenzaprine (FLEXERIL) 10 MG tablet Take 10 mg by mouth as needed for muscle spasms.      Marland Kitchen diltiazem (CARDIZEM CD) 240 MG 24 hr capsule Take 1 capsule (240 mg total) by mouth daily.  90 capsule  3  . flecainide (TAMBOCOR) 150 MG tablet Take 1 tablet (150 mg total) by mouth 2 (two) times daily.  60 tablet  3  . lisinopril-hydrochlorothiazide (PRINZIDE,ZESTORETIC) 20-12.5 MG per tablet Take 2 tablets by mouth daily.       . metFORMIN (GLUCOPHAGE) 500 MG tablet Take 500 mg by mouth 3 (three) times daily.      . metoprolol (TOPROL-XL) 200 MG 24 hr tablet Take 200 mg by mouth daily.      Marland Kitchen omeprazole (PRILOSEC) 20 MG capsule Take 20 mg by mouth daily.      Marland Kitchen zolpidem  (AMBIEN) 10 MG tablet Take 10 mg by mouth at bedtime as needed for sleep.       No current facility-administered medications for this visit.    History   Social History  . Marital Status: Married    Spouse Name: N/A    Number of Children: N/A  . Years of Education: N/A   Occupational History  . Not on file.   Social History Main Topics  . Smoking status: Never Smoker   . Smokeless tobacco: Never Used  . Alcohol Use: Not on file  . Drug Use: Not on file  . Sexual Activity: Not on file   Other Topics Concern  . Not on file   Social History Narrative  . No narrative on file    No family history on file.  Past Medical History  Diagnosis Date  . Diabetes mellitus   . Dyslipidemia   . Hypertension   . Overweight(278.02)   . Atrial fibrillation   . Heart murmur     As a Child  . Ejection fraction     EF 55-60%, echo, May, 2014    Past Surgical History  Procedure Laterality Date  . Back surgery  2011/08    Patient Active  Problem List   Diagnosis Date Noted  . Altered taste 09/21/2012  . Normal cardiac stress test 08/16/2012  . Ejection fraction   . Diabetes mellitus   . Dyslipidemia   . Hypertension   . Overweight(278.02)   . Atrial fibrillation     ROS   Patient denies fever, chills, headache, sweats, rash, change in vision, change in hearing, chest pain, cough, nausea vomiting, urinary symptoms. All other systems are reviewed and are negative.  PHYSICAL EXAM  Patient is oriented to person time and place. Affect is normal. There is no jugulovenous distention. Lungs are clear. Respiratory effort is nonlabored. Cardiac exam reveals S1 and S2. There are no clicks or significant murmurs. Abdomen is soft. The rhythm is irregularly irregular. There no musculoskeletal deformities. There are no skin rashes.  Filed Vitals:   12/24/12 0933 12/24/12 0937 12/24/12 0939  BP: 159/117 159/117 140/113  Pulse:  111   Height: 6' (1.829 m)    Weight: 252 lb (114.306 kg)      EKG is done today and reviewed by me. There is coarse atrial fibrillation. His inferior leads are difficult to assess. These are not changed. There is no definite proof of an inferior MI.  ASSESSMENT & PLAN

## 2012-12-24 NOTE — Patient Instructions (Signed)
Your physician recommends that you schedule a follow-up appointment on January 16, 2013. Please get the time at the check out today. Your physician has recommended you make the following change in your medication: Increase your diltiazem to 240 mg twice daily. Your new prescription has been sent to your pharmacy. All other medications will remain the same. Your physician has requested that you regularly monitor and record your blood pressure readings at home. Please use the same machine at random times of day to check your readings and record them to bring to your follow-up visit. Your physician has requested that you come to the office on January 07, 2013 for a nurse visit to have an EKG and vitals. Please get the time for this at the check out today.

## 2012-12-24 NOTE — Assessment & Plan Note (Addendum)
I've had a full discussion with the patient about his atrial fib. I have offered him the option to use consider repeat cardioversion on high-dose flecainide. The flecainide level is pending. The patient prefers not to try cardioversion again as of today. I've also talked with him about seeing Dr. Johney Frame to review the overall situation. The purpose would be to give further recommendations including whether or not atrial fibrillation should be considered. Another option would be to consider using a drug such as amiodarone. The patient is anticoagulated. As of today he prefers to wait for another period of time. He feels it in the past and has taken longer periods of time for him to adjust her medications. I am concerned that his atrial fibrillation rate today is slightly increased. The increased diltiazem will help with this also. If his flecainide level is too high, I will stop the drug. I do not think there is good evidence that he will hold regular rhythm with a lower dose.  As part of today's evaluation I spent greater than 25 minutes with his total care. More than half of this time was with direct contact with him. I had extensive discussions with him about the approach to his age with fibrillation and his hypertension.

## 2012-12-24 NOTE — Assessment & Plan Note (Signed)
We have repeated the patient's blood pressure here today. He continues to run in the range of 160/105. He does have atrial fib. I'm sure that he is anxious to be here. He is on multiple medications. It is now time to rule out renal artery stenosis. We will have him check his blood pressures at home carefully. In addition he will have a renal artery Doppler that we'll be arranged. In addition his diltiazem dose will be increased to 240 mg twice a day. I've chosen not to make any other changes in his meds at this time for his blood pressure.

## 2012-12-25 ENCOUNTER — Telehealth: Payer: Self-pay | Admitting: *Deleted

## 2012-12-25 NOTE — Telephone Encounter (Signed)
Message copied by Eustace Moore on Tue Dec 25, 2012  8:11 AM ------      Message from: Myrtis Ser, [place] D      Created: Mon Dec 24, 2012  4:03 PM       Please let the patient now that the flecainide level is in a good range ------

## 2012-12-25 NOTE — Telephone Encounter (Signed)
Patient informed via vm. 

## 2013-01-07 ENCOUNTER — Encounter: Payer: Self-pay | Admitting: *Deleted

## 2013-01-07 ENCOUNTER — Ambulatory Visit (INDEPENDENT_AMBULATORY_CARE_PROVIDER_SITE_OTHER): Payer: BC Managed Care – PPO | Admitting: *Deleted

## 2013-01-07 VITALS — BP 156/80 | HR 64 | Wt 251.0 lb

## 2013-01-07 DIAGNOSIS — I4891 Unspecified atrial fibrillation: Secondary | ICD-10-CM

## 2013-01-07 NOTE — Progress Notes (Signed)
Patient presents to office for nurse visit requested at last office visit. Patient has taken all of his medications as prescribed except missing morning doses of flecainide and diltiazem on yesterday morning. No side effects noted. Patient denies chest pain, dizziness or sob. Patient brought home blood pressure readings to office today and also his home monitor. Home monitor results showed 176/84. Nurse advised patient that his monitor was too small and advised patient to get a larger cuff. Patient informed that MD would be notified of results from today's nurse visit and if any changes suggested, patient would be called. Patient verbalized understanding of plan.

## 2013-01-16 ENCOUNTER — Ambulatory Visit (INDEPENDENT_AMBULATORY_CARE_PROVIDER_SITE_OTHER): Payer: BC Managed Care – PPO | Admitting: Cardiology

## 2013-01-16 ENCOUNTER — Encounter: Payer: Self-pay | Admitting: Cardiology

## 2013-01-16 VITALS — BP 175/90 | HR 76 | Ht 72.0 in | Wt 252.0 lb

## 2013-01-16 DIAGNOSIS — I10 Essential (primary) hypertension: Secondary | ICD-10-CM

## 2013-01-16 DIAGNOSIS — I4891 Unspecified atrial fibrillation: Secondary | ICD-10-CM

## 2013-01-16 NOTE — Patient Instructions (Signed)
Your physician recommends that you schedule a follow-up appointment in: 3-4 weeks. Your physician recommends that you continue on your current medications as directed. Please refer to the Current Medication list given to you today.

## 2013-01-16 NOTE — Assessment & Plan Note (Signed)
The patient once again has return of atrial fibrillation. He is on flecainide 150 twice a day with a level of 0.55. As of today I have recommended increasing him to 200 mg twice a day. It is known that the dose this high can be used in some patients. He wants to wait longer at 150 twice a day. Then we will consider raising the dose again. Other options include other medications or atrial fibrillation. He prefers to stay on 150 twice a day of flecainide at this time.  I spent extensive time talking about all these issues with him on a regular basis.

## 2013-01-16 NOTE — Assessment & Plan Note (Signed)
Blood pressure is now controlled. He's checking it at home and it is definitely controlled on his current medicines.

## 2013-01-16 NOTE — Progress Notes (Signed)
HPI  Patient is seen back to follow up atrial fibrillation. I saw him last December 24, 2012. Diltiazem dose was increased and his blood pressure improved. He has blood pressures from home showing a systolic in the range of 086. His heart rate is well controlled. EKG on January 07, 2013 showed sinus rhythm. However EKG today shows atrial fib with a rate controlled. He says that he felt well until 3 days ago. It is possible that this is when he reverted to atrial fib.  No Known Allergies  Current Outpatient Prescriptions  Medication Sig Dispense Refill  . apixaban (ELIQUIS) 5 MG TABS tablet Take 5 mg by mouth 2 (two) times daily.      Marland Kitchen aspirin 81 MG tablet Take 81 mg by mouth daily. On hold as of  May 25, 2012      . atorvastatin (LIPITOR) 10 MG tablet Take by mouth. 0.5 Tablets 3 days a week      . cyclobenzaprine (FLEXERIL) 10 MG tablet Take 10 mg by mouth as needed for muscle spasms.      Marland Kitchen diltiazem (CARDIZEM CD) 240 MG 24 hr capsule Take 1 capsule (240 mg total) by mouth 2 (two) times daily.  180 capsule  3  . flecainide (TAMBOCOR) 150 MG tablet Take 1 tablet (150 mg total) by mouth 2 (two) times daily.  60 tablet  3  . lisinopril-hydrochlorothiazide (PRINZIDE,ZESTORETIC) 20-12.5 MG per tablet Take 2 tablets by mouth daily.       . metFORMIN (GLUCOPHAGE) 500 MG tablet Take 500 mg by mouth 3 (three) times daily.      . metoprolol (TOPROL-XL) 200 MG 24 hr tablet Take 200 mg by mouth daily.      Marland Kitchen omeprazole (PRILOSEC) 20 MG capsule Take 20 mg by mouth daily.       No current facility-administered medications for this visit.    History   Social History  . Marital Status: Married    Spouse Name: N/A    Number of Children: N/A  . Years of Education: N/A   Occupational History  . Not on file.   Social History Main Topics  . Smoking status: Never Smoker   . Smokeless tobacco: Never Used  . Alcohol Use: Not on file  . Drug Use: Not on file  . Sexual Activity: Not on file    Other Topics Concern  . Not on file   Social History Narrative  . No narrative on file    No family history on file.  Past Medical History  Diagnosis Date  . Diabetes mellitus   . Dyslipidemia   . Hypertension   . Overweight   . Atrial fibrillation   . Heart murmur     As a Child  . Ejection fraction     EF 55-60%, echo, May, 2014    Past Surgical History  Procedure Laterality Date  . Back surgery  2011/08    Patient Active Problem List   Diagnosis Date Noted  . Altered taste 09/21/2012  . Normal cardiac stress test 08/16/2012  . Ejection fraction   . Diabetes mellitus   . Dyslipidemia   . Hypertension   . Overweight   . Atrial fibrillation     ROS   Patient denies fever, chills, headache, sweats, rash, change in vision, change in hearing, chest pain, cough, nausea vomiting, urinary symptoms. All other systems are reviewed and are negative.  PHYSICAL EXAM   Patient is oriented to person time and  place. Affect is normal. There is no jugulovenous distention. Lungs are clear. Respiratory effort is nonlabored. Cardiac exam reveals S1 and S2. There no clicks or significant murmurs. Abdomen is soft. There is no peripheral edema.  Filed Vitals:   01/16/13 1523  BP: 175/90  Pulse: 76  Height: 6' (1.829 m)  Weight: 252 lb (114.306 kg)   EKG is done today and reviewed by me. It reveals atrial fibrillation with a controlled rate. Once again the rhythm has the appearance of possible flutter. It has been felt by Dr. Rayann Heman that this is atrial fib when other EKGs reviewed.  ASSESSMENT & PLAN

## 2013-02-18 ENCOUNTER — Ambulatory Visit: Payer: BC Managed Care – PPO | Admitting: Cardiology

## 2013-03-05 ENCOUNTER — Ambulatory Visit (INDEPENDENT_AMBULATORY_CARE_PROVIDER_SITE_OTHER): Payer: BC Managed Care – PPO | Admitting: Cardiology

## 2013-03-05 ENCOUNTER — Encounter: Payer: Self-pay | Admitting: Cardiology

## 2013-03-05 VITALS — BP 148/75 | HR 48 | Ht 73.0 in | Wt 255.4 lb

## 2013-03-05 DIAGNOSIS — I4891 Unspecified atrial fibrillation: Secondary | ICD-10-CM

## 2013-03-05 DIAGNOSIS — I10 Essential (primary) hypertension: Secondary | ICD-10-CM

## 2013-03-05 DIAGNOSIS — R001 Bradycardia, unspecified: Secondary | ICD-10-CM

## 2013-03-05 DIAGNOSIS — I498 Other specified cardiac arrhythmias: Secondary | ICD-10-CM

## 2013-03-05 NOTE — Assessment & Plan Note (Signed)
Patient is stable today on the plan that we have outlined. For now he wants to remain on 150 mg twice daily and flecainide.

## 2013-03-05 NOTE — Assessment & Plan Note (Addendum)
He continues to have mild systolic hypertension. I will increase the ACE inhibitor component of his combination medicine. After further review I now see that he is actually taking 2 tabs of 20/12.5 combination of ACE and diuretic. Therefore we will keep his medicines same. We will educate him further concerning reducing his salt intake.

## 2013-03-05 NOTE — Assessment & Plan Note (Signed)
Resting rate is 50. He is on high-dose metoprolol. However like to decrease this but he wants to keep it the same as he has taken it for a long time. He is not having any significant symptoms. No change in his beta blocker dose at this time.

## 2013-03-05 NOTE — Progress Notes (Signed)
HPI   Patient is seen today to followup his atrial arrhythmias. He has paroxysmal atrial fibrillation. At one point I thought he might have flutter. I reviewed this with Dr. Rayann Heman and we agreed that it was probably coarse atrial fib. Strong consideration can be given to ablation in this patient. He does not want to proceed with this yet. We can also try other medications including Tikosyn or amiodarone. However we have decided to stay at flecainide 150 mg twice a day. With the addition of diltiazem he seems to be doing well and today he is in sinus rhythm. He is anticoagulated.  No Known Allergies  Current Outpatient Prescriptions  Medication Sig Dispense Refill  . apixaban (ELIQUIS) 5 MG TABS tablet Take 5 mg by mouth 2 (two) times daily.      Marland Kitchen aspirin 81 MG tablet Take 81 mg by mouth daily. On hold as of  May 25, 2012      . atorvastatin (LIPITOR) 10 MG tablet Take by mouth. 0.5 Tablets 3 days a week      . cyclobenzaprine (FLEXERIL) 10 MG tablet Take 10 mg by mouth as needed for muscle spasms.      Marland Kitchen diltiazem (CARDIZEM CD) 240 MG 24 hr capsule Take 1 capsule (240 mg total) by mouth 2 (two) times daily.  180 capsule  3  . flecainide (TAMBOCOR) 150 MG tablet Take 1 tablet (150 mg total) by mouth 2 (two) times daily.  60 tablet  3  . lisinopril-hydrochlorothiazide (PRINZIDE,ZESTORETIC) 20-12.5 MG per tablet Take 2 tablets by mouth daily.       . metFORMIN (GLUCOPHAGE) 500 MG tablet Take 1,500 mg by mouth every morning.       . metoprolol (TOPROL-XL) 200 MG 24 hr tablet Take 200 mg by mouth daily.      Marland Kitchen omeprazole (PRILOSEC) 20 MG capsule Take 20 mg by mouth daily.       No current facility-administered medications for this visit.    History   Social History  . Marital Status: Married    Spouse Name: N/A    Number of Children: N/A  . Years of Education: N/A   Occupational History  . Not on file.   Social History Main Topics  . Smoking status: Never Smoker   . Smokeless  tobacco: Never Used  . Alcohol Use: Not on file  . Drug Use: Not on file  . Sexual Activity: Not on file   Other Topics Concern  . Not on file   Social History Narrative  . No narrative on file    No family history on file.  Past Medical History  Diagnosis Date  . Diabetes mellitus   . Dyslipidemia   . Hypertension   . Overweight   . Atrial fibrillation   . Heart murmur     As a Child  . Ejection fraction     EF 55-60%, echo, May, 2014    Past Surgical History  Procedure Laterality Date  . Back surgery  2011/08    Patient Active Problem List   Diagnosis Date Noted  . Altered taste 09/21/2012  . Normal cardiac stress test 08/16/2012  . Ejection fraction   . Diabetes mellitus   . Dyslipidemia   . Hypertension   . Overweight   . Atrial fibrillation     ROS   Patient denies fever, chills, headache, sweats, rash, change in vision, change in hearing, chest pain, cough, nausea vomiting, urinary symptoms. All other systems  are reviewed and are negative.  PHYSICAL EXAM  Patient is stable. He is overweight. He is feeling well. He is oriented to person time and place. Affect is normal. There is no jugulovenous distention. Lungs are clear. Respiratory effort is nonlabored. Cardiac exam reveals S1 and S2. There no clicks or significant murmurs. Abdomen is soft. Is no peripheral edema.  Filed Vitals:   03/05/13 1404 03/05/13 1409  BP: 173/90 171/89  Pulse: 49 48  Height: 6\' 1"  (1.854 m)   Weight: 255 lb 6.4 oz (115.849 kg) 255 lb 6.4 oz (115.849 kg)  SpO2: 95%    EKG is done today and reviewed by me. He does have sinus rhythm with sinus bradycardia today. The rate is 50.  ASSESSMENT & PLAN

## 2013-03-05 NOTE — Patient Instructions (Signed)
Your physician recommends that you schedule a follow-up appointment in: 6 weeks. Your physician recommends that you continue on your current medications as directed. Please refer to the Current Medication list given to you today. Your physician has requested that you regularly monitor and record your blood pressure readings at home. Please use the same machine at the same time of day to check your readings and record them to bring to your follow-up visit. Please limit your sodium intake. Refrain from foods high in sodium.

## 2013-05-01 ENCOUNTER — Encounter: Payer: Self-pay | Admitting: Cardiology

## 2013-05-01 ENCOUNTER — Ambulatory Visit (INDEPENDENT_AMBULATORY_CARE_PROVIDER_SITE_OTHER): Payer: BC Managed Care – PPO | Admitting: Cardiology

## 2013-05-01 VITALS — BP 144/77 | HR 51 | Ht 73.0 in | Wt 253.8 lb

## 2013-05-01 DIAGNOSIS — R001 Bradycardia, unspecified: Secondary | ICD-10-CM

## 2013-05-01 DIAGNOSIS — I4891 Unspecified atrial fibrillation: Secondary | ICD-10-CM

## 2013-05-01 DIAGNOSIS — I10 Essential (primary) hypertension: Secondary | ICD-10-CM

## 2013-05-01 DIAGNOSIS — I498 Other specified cardiac arrhythmias: Secondary | ICD-10-CM

## 2013-05-01 NOTE — Assessment & Plan Note (Signed)
He is stable at this time on 150 mg twice a day of flecainide.

## 2013-05-01 NOTE — Assessment & Plan Note (Signed)
I've instructed him that if he has significant slowing of his heart rate and he feels poorly, he should cut his metoprolol dose in half

## 2013-05-01 NOTE — Patient Instructions (Signed)
Your physician recommends that you schedule a follow-up appointment in: 3 months. Your physician recommends that you continue on your current medications as directed. Please refer to the Current Medication list given to you today. 

## 2013-05-01 NOTE — Assessment & Plan Note (Signed)
Blood pressure is controlled. No change in therapy. 

## 2013-05-01 NOTE — Progress Notes (Signed)
Patient ID: Nathan Sutton, male   DOB: 1950/03/08, 63 y.o.   MRN: [id number]    HPI  Patient is seen for followup of atrial fibrillation. He is has paroxysmal atrial fibrillation. He does not feel it. He has risk factors and he is anticoagulated. He would be a very good candidate for atrial fibrillation if we decide to do this. He is on flecainide. He has not been tried on Tikosyn or amiodarone. At this point he has been in sinus rhythm on 2 consecutive visits on flecainide 150 mg twice a day. He's also on high-dose diltiazem and metoprolol. He checks his blood pressure and his heart rate meticulously at home. He's actually doing well. He does have some bradycardia at times. He's not having any marked symptoms from this.  No Known Allergies  Current Outpatient Prescriptions  Medication Sig Dispense Refill  . apixaban (ELIQUIS) 5 MG TABS tablet Take 5 mg by mouth 2 (two) times daily.      Marland Kitchen atorvastatin (LIPITOR) 10 MG tablet Take by mouth. 0.5 Tablets 3 days a week      . diltiazem (CARDIZEM CD) 240 MG 24 hr capsule Take 1 capsule (240 mg total) by mouth 2 (two) times daily.  180 capsule  3  . flecainide (TAMBOCOR) 150 MG tablet Take 1 tablet (150 mg total) by mouth 2 (two) times daily.  60 tablet  3  . lisinopril-hydrochlorothiazide (PRINZIDE,ZESTORETIC) 20-12.5 MG per tablet Take 2 tablets by mouth daily.       . metFORMIN (GLUCOPHAGE) 500 MG tablet Take 1,500 mg by mouth every morning.       . metoprolol (TOPROL-XL) 200 MG 24 hr tablet Take 200 mg by mouth daily.      Marland Kitchen omeprazole (PRILOSEC) 20 MG capsule Take 20 mg by mouth daily.      Marland Kitchen aspirin 81 MG tablet Take 81 mg by mouth daily. On hold as of  May 25, 2012      . cyclobenzaprine (FLEXERIL) 10 MG tablet Take 10 mg by mouth as needed for muscle spasms.       No current facility-administered medications for this visit.    History   Social History  . Marital Status: Married    Spouse Name: N/A    Number of Children: N/A  . Years of  Education: N/A   Occupational History  . Not on file.   Social History Main Topics  . Smoking status: Never Smoker   . Smokeless tobacco: Never Used  . Alcohol Use: Not on file  . Drug Use: Not on file  . Sexual Activity: Not on file   Other Topics Concern  . Not on file   Social History Narrative  . No narrative on file    No family history on file.  Past Medical History  Diagnosis Date  . Diabetes mellitus   . Dyslipidemia   . Hypertension   . Overweight   . Atrial fibrillation   . Heart murmur     As a Child  . Ejection fraction     EF 55-60%, echo, May, 2014    Past Surgical History  Procedure Laterality Date  . Back surgery  2011/08    Patient Active Problem List   Diagnosis Date Noted  . Bradycardia 03/05/2013  . Altered taste 09/21/2012  . Normal cardiac stress test 08/16/2012  . Ejection fraction   . Diabetes mellitus   . Dyslipidemia   . Hypertension   . Overweight   .  Atrial fibrillation    ROS:  Patient denies fever, chills, headache, sweats, rash, change in vision, change in hearing, chest pain, cough, nausea vomiting, urinary symptoms. All other systems are reviewed and are negative.  PHYSICAL EXAM   Patient's overweight. He is oriented to person time and place. Affect is normal. There is no jugulovenous distention. Lungs are clear. Respiratory effort is not labored. Head is atraumatic. Sclera and conjunctiva are normal. Cardiac exam reveals S1 and S2. The abdomen is soft. There is no peripheral edema. There no musculoskeletal deformities. There are no skin rashes.  Filed Vitals:   05/01/13 1439  BP: 144/77  Pulse: 51  Height: 6\' 1"  (1.854 m)  Weight: 253 lb 12.8 oz (115.123 kg)  SpO2: 98%     ASSESSMENT & PLAN

## 2013-05-03 ENCOUNTER — Other Ambulatory Visit: Payer: Self-pay | Admitting: *Deleted

## 2013-05-03 MED ORDER — FLECAINIDE ACETATE 150 MG PO TABS: 150.0000 mg | ORAL_TABLET | Freq: Two times a day (BID) | ORAL | Status: DC

## 2013-08-05 ENCOUNTER — Encounter: Payer: Self-pay | Admitting: Cardiology

## 2013-08-05 ENCOUNTER — Ambulatory Visit (INDEPENDENT_AMBULATORY_CARE_PROVIDER_SITE_OTHER): Payer: BC Managed Care – PPO | Admitting: Cardiology

## 2013-08-05 VITALS — BP 166/78 | HR 52 | Ht 72.0 in | Wt 247.7 lb

## 2013-08-05 DIAGNOSIS — I498 Other specified cardiac arrhythmias: Secondary | ICD-10-CM

## 2013-08-05 DIAGNOSIS — I4891 Unspecified atrial fibrillation: Secondary | ICD-10-CM

## 2013-08-05 DIAGNOSIS — R001 Bradycardia, unspecified: Secondary | ICD-10-CM

## 2013-08-05 DIAGNOSIS — I10 Essential (primary) hypertension: Secondary | ICD-10-CM

## 2013-08-05 NOTE — Progress Notes (Signed)
Patient ID: Nathan Sutton, male   DOB: August 06, 1950, 63 y.o.   MRN: [id number]    HPI  Patient returns for followup of his atrial fibrillation. He is holding sinus. He would be a good candidate for atrial fibrillation ablation if we were decide to do this. However he has not been tried on Tikosyn or amiodarone. But he is holding sinus rhythm on high-dose flecainide. He is stable. He has lost some weight and he looks good.  No Known Allergies  Current Outpatient Prescriptions  Medication Sig Dispense Refill  . apixaban (ELIQUIS) 5 MG TABS tablet Take 5 mg by mouth 2 (two) times daily.      Marland Kitchen atorvastatin (LIPITOR) 10 MG tablet Take by mouth. 0.5 Tablets 3 days a week      . cyclobenzaprine (FLEXERIL) 10 MG tablet Take 10 mg by mouth as needed for muscle spasms.      Marland Kitchen diltiazem (CARDIZEM CD) 240 MG 24 hr capsule Take 1 capsule (240 mg total) by mouth 2 (two) times daily.  180 capsule  3  . flecainide (TAMBOCOR) 150 MG tablet Take 1 tablet (150 mg total) by mouth 2 (two) times daily.  180 tablet  3  . lisinopril-hydrochlorothiazide (PRINZIDE,ZESTORETIC) 20-12.5 MG per tablet Take 2 tablets by mouth daily.       . Melatonin 3 MG TABS Take 0.5 tablets by mouth at bedtime as needed.      . metFORMIN (GLUCOPHAGE) 500 MG tablet Take 1,000 mg by mouth 2 (two) times daily.       . metoprolol (TOPROL-XL) 200 MG 24 hr tablet Take 200 mg by mouth daily.      Marland Kitchen omeprazole (PRILOSEC) 20 MG capsule Take 20 mg by mouth daily.       No current facility-administered medications for this visit.    History   Social History  . Marital Status: Married    Spouse Name: N/A    Number of Children: N/A  . Years of Education: N/A   Occupational History  . Not on file.   Social History Main Topics  . Smoking status: Never Smoker   . Smokeless tobacco: Never Used  . Alcohol Use: Not on file  . Drug Use: Not on file  . Sexual Activity: Not on file   Other Topics Concern  . Not on file   Social History  Narrative  . No narrative on file    No family history on file.  Past Medical History  Diagnosis Date  . Diabetes mellitus   . Dyslipidemia   . Hypertension   . Overweight(278.02)   . Atrial fibrillation   . Heart murmur     As a Child  . Ejection fraction     EF 55-60%, echo, May, 2014    Past Surgical History  Procedure Laterality Date  . Back surgery  2011/08    Patient Active Problem List   Diagnosis Date Noted  . Bradycardia 03/05/2013  . Altered taste 09/21/2012  . Normal cardiac stress test 08/16/2012  . Ejection fraction   . Diabetes mellitus   . Dyslipidemia   . Hypertension   . Overweight   . Atrial fibrillation     ROS   Patient denies fever, chills, headache, sweats, rash, change in vision, change in hearing, chest pain, cough, nausea vomiting, urinary symptoms. All other systems are reviewed and are negative.  PHYSICAL EXAM  Patient is oriented to person time and place. Affect is normal. He has lost some weight.  Head is atraumatic sclera and conjunctiva are normal. There is no jugulovenous distention. Lungs are clear. Respiratory effort is nonlabored. Cardiac exam reveals S1 and S2. The abdomen is soft. There is no peripheral edema.  Filed Vitals:   08/05/13 1405  BP: 166/78  Pulse: 52  Height: 6' (1.829 m)  Weight: 247 lb 11.2 oz (112.356 kg)  SpO2: 96%   EKG is done today and reviewed by me. He has normal sinus rhythm.  ASSESSMENT & PLAN

## 2013-08-05 NOTE — Assessment & Plan Note (Signed)
He is holding sinus rhythm on flecainide 150 mg twice a day.

## 2013-08-05 NOTE — Assessment & Plan Note (Signed)
Heart rate is 55. He stable with this. No change in therapy.

## 2013-08-05 NOTE — Patient Instructions (Signed)

## 2013-08-05 NOTE — Assessment & Plan Note (Signed)
Blood pressure is under reasonable control. No change in therapy.

## 2013-12-23 ENCOUNTER — Other Ambulatory Visit: Payer: Self-pay | Admitting: *Deleted

## 2013-12-23 MED ORDER — DILTIAZEM HCL ER COATED BEADS 240 MG PO CP24: 240.0000 mg | ORAL_CAPSULE | Freq: Two times a day (BID) | ORAL | Status: DC

## 2014-01-31 ENCOUNTER — Ambulatory Visit (INDEPENDENT_AMBULATORY_CARE_PROVIDER_SITE_OTHER): Payer: BLUE CROSS/BLUE SHIELD | Admitting: Cardiology

## 2014-01-31 ENCOUNTER — Encounter: Payer: Self-pay | Admitting: Cardiology

## 2014-01-31 VITALS — BP 148/100 | HR 60 | Ht 73.0 in | Wt 235.0 lb

## 2014-01-31 DIAGNOSIS — I480 Paroxysmal atrial fibrillation: Secondary | ICD-10-CM

## 2014-01-31 DIAGNOSIS — I10 Essential (primary) hypertension: Secondary | ICD-10-CM

## 2014-01-31 DIAGNOSIS — I4891 Unspecified atrial fibrillation: Secondary | ICD-10-CM

## 2014-01-31 NOTE — Assessment & Plan Note (Signed)
He is holding sinus rhythm on flecainide. No change in therapy.

## 2014-01-31 NOTE — Patient Instructions (Signed)
Continue all current medications. Follow up in  2 months  

## 2014-01-31 NOTE — Progress Notes (Signed)
HPI Patient is seen today to follow-up paroxysmal atrial fibrillation and hypertension. He's feeling well. He is not having any chest pain or shortness of breath.  No Known Allergies  Current Outpatient Prescriptions  Medication Sig Dispense Refill  . apixaban (ELIQUIS) 5 MG TABS tablet Take 5 mg by mouth 2 (two) times daily.    Marland Kitchen atorvastatin (LIPITOR) 10 MG tablet Take by mouth. 0.5 Tablets 3 days a week    . cyclobenzaprine (FLEXERIL) 10 MG tablet Take 10 mg by mouth as needed for muscle spasms.    Marland Kitchen diltiazem (CARDIZEM CD) 240 MG 24 hr capsule Take 1 capsule (240 mg total) by mouth 2 (two) times daily. 180 capsule 3  . flecainide (TAMBOCOR) 150 MG tablet Take 1 tablet (150 mg total) by mouth 2 (two) times daily. 180 tablet 3  . lisinopril-hydrochlorothiazide (PRINZIDE,ZESTORETIC) 20-12.5 MG per tablet Take 2 tablets by mouth daily.     . metFORMIN (GLUCOPHAGE) 500 MG tablet Take 1,000 mg by mouth 2 (two) times daily.     . metoprolol (TOPROL-XL) 200 MG 24 hr tablet Take 200 mg by mouth daily.    Marland Kitchen omeprazole (PRILOSEC) 20 MG capsule Take 20 mg by mouth daily.     No current facility-administered medications for this visit.    History   Social History  . Marital Status: Married    Spouse Name: N/A    Number of Children: N/A  . Years of Education: N/A   Occupational History  . Not on file.   Social History Main Topics  . Smoking status: Never Smoker   . Smokeless tobacco: Never Used  . Alcohol Use: Not on file  . Drug Use: Not on file  . Sexual Activity: Not on file   Other Topics Concern  . Not on file   Social History Narrative   Family history The patient denies any strong family history of coronary disease.  Past Medical History  Diagnosis Date  . Diabetes mellitus   . Dyslipidemia   . Hypertension   . Overweight(278.02)   . Atrial fibrillation   . Heart murmur     As a Child  . Ejection fraction     EF 55-60%, echo, May, 2014    Past Surgical  History  Procedure Laterality Date  . Back surgery  2011/08    Patient Active Problem List   Diagnosis Date Noted  . Bradycardia 03/05/2013  . Altered taste 09/21/2012  . Normal cardiac stress test 08/16/2012  . Ejection fraction   . Diabetes mellitus   . Dyslipidemia   . Hypertension   . Overweight   . Atrial fibrillation     ROS  Patient denies fever, chills, headache, sweats, rash, change in vision, change in hearing, chest pain, cough, nausea or vomiting, urinary symptoms. He has slight intermittent numbness in the tips of his fingers and in his toes. This comes and goes. All other systems are reviewed and are negative other than the history of present illness.  PHYSICAL EXAM Patient is lost a few pounds. He is oriented to person time and place. Affect is normal. Head is atraumatic. Sclera and conjunctiva are normal. There is no jugular venous distention. Lungs are clear. Respiratory effort is unlabored. Cardiac exam reveals an S1 and S2. Abdomen is soft. There is no peripheral edema. There are no musculoskeletal deformities. There are no skin rashes. Neurologic is grossly intact.  Filed Vitals:   01/31/14 1041  BP: 148/100  Pulse: 60  Height: 6\' 1"  (1.854 m)  Weight: 235 lb (106.595 kg)  SpO2: 98%   EKG today reveals sinus rhythm with sinus bradycardia. It is reviewed by me.  ASSESSMENT & PLAN

## 2014-01-31 NOTE — Assessment & Plan Note (Signed)
Blood pressure today is high in the office. He brought a list of pressures from home. At home his diastolic is in the range of 75 daily. His systolic does range up from 145-180. I talked with him about adjusting his meds further. Because his rate has been controlled, we may be able to change his diltiazem to amlodipine to CV gets better blood pressure control. He would like to wait an additional 60 days because he just obtained 90 days of the medication. He will gather more blood pressure down at home and cut his salt intake. I will then see him back in 60 days to reassess.

## 2014-04-09 ENCOUNTER — Encounter: Payer: Self-pay | Admitting: Cardiology

## 2014-04-09 ENCOUNTER — Ambulatory Visit (INDEPENDENT_AMBULATORY_CARE_PROVIDER_SITE_OTHER): Payer: BLUE CROSS/BLUE SHIELD | Admitting: Cardiology

## 2014-04-09 VITALS — BP 160/84 | HR 50 | Ht 73.0 in | Wt 232.0 lb

## 2014-04-09 DIAGNOSIS — Z7901 Long term (current) use of anticoagulants: Secondary | ICD-10-CM

## 2014-04-09 DIAGNOSIS — I480 Paroxysmal atrial fibrillation: Secondary | ICD-10-CM

## 2014-04-09 DIAGNOSIS — I10 Essential (primary) hypertension: Secondary | ICD-10-CM | POA: Diagnosis not present

## 2014-04-09 DIAGNOSIS — R001 Bradycardia, unspecified: Secondary | ICD-10-CM

## 2014-04-09 MED ORDER — AMLODIPINE BESYLATE 5 MG PO TABS: 5.0000 mg | ORAL_TABLET | Freq: Every day | ORAL | Status: DC

## 2014-04-09 NOTE — Assessment & Plan Note (Signed)
We will change diltiazem to amlodipine and see how his blood pressure adjusts.

## 2014-04-09 NOTE — Patient Instructions (Signed)
Your physician recommends that you schedule a follow-up appointment in: 6 weeks. Your physician has recommended you make the following change in your medication:  Stop diltiazem or cardizem cd. Start amlodipine 5 mg daily. Continue all other medications the same.

## 2014-04-09 NOTE — Assessment & Plan Note (Signed)
He continues to hold sinus rhythm. There is sinus bradycardia. He has intermittently cut back the dose of his beta blocker. When he does this, he notices an increase in his blood pressure but he does not feel palpitations. Plan for now is to continue flecainide.

## 2014-04-09 NOTE — Progress Notes (Signed)
Cardiology Office Note   Date:  04/09/2014   ID:  Nathan Sutton, DOB Oct 21, 1950, MRN [id number]  PCP:  Manon Hilding, MD  Cardiologist:  Dola Argyle, MD   Chief Complaint  Patient presents with  . Appointment    Follow-up paroxysmal atrial fibrillation      History of Present Illness: Nathan Sutton is a 64 y.o. male who presents today to follow-up atrial fibrillation and hypertension. He has continued to hold sinus rhythm. He is on flecainide. He has sinus bradycardia. Historically he has been on high-dose metoprolol. When I saw him last his blood pressure was elevated. We talked about changing him from diltiazem to amlodipine. He wanted to wait until more of his 90 day supply of diltiazem had been used up. He also mentions today that his diltiazem preparation has an unusual smell. He has intermittent fatigue. He has not had syncope or presyncope. He has never been able to sense his rhythm when it is in atrial fibrillation. He is anticoagulated.    Past Medical History  Diagnosis Date  . Diabetes mellitus   . Dyslipidemia   . Hypertension   . Overweight(278.02)   . Atrial fibrillation   . Heart murmur     As a Child  . Ejection fraction     EF 55-60%, echo, May, 2014    Past Surgical History  Procedure Laterality Date  . Back surgery  2011/08    Patient Active Problem List   Diagnosis Date Noted  . Bradycardia 03/05/2013  . Altered taste 09/21/2012  . Normal cardiac stress test 08/16/2012  . Ejection fraction   . Diabetes mellitus   . Dyslipidemia   . Hypertension   . Overweight   . Atrial fibrillation       Current Outpatient Prescriptions  Medication Sig Dispense Refill  . apixaban (ELIQUIS) 5 MG TABS tablet Take 5 mg by mouth 2 (two) times daily.    Marland Kitchen atorvastatin (LIPITOR) 10 MG tablet Take by mouth. 0.5 Tablets 3 days a week    . cyclobenzaprine (FLEXERIL) 10 MG tablet Take 10 mg by mouth as needed for muscle spasms.    . flecainide (TAMBOCOR) 150 MG  tablet Take 1 tablet (150 mg total) by mouth 2 (two) times daily. 180 tablet 3  . lisinopril-hydrochlorothiazide (PRINZIDE,ZESTORETIC) 20-12.5 MG per tablet Take 2 tablets by mouth daily.     . metFORMIN (GLUCOPHAGE) 500 MG tablet Take 1,500 mg by mouth daily. 1500 mg daily    . metoprolol (TOPROL-XL) 200 MG 24 hr tablet Take 200 mg by mouth daily.    Marland Kitchen omeprazole (PRILOSEC) 20 MG capsule Take 20 mg by mouth daily.    Marland Kitchen amLODipine (NORVASC) 5 MG tablet Take 1 tablet (5 mg total) by mouth daily. 30 tablet 1   No current facility-administered medications for this visit.    Allergies:   Review of patient's allergies indicates no known allergies.    Social History:  The patient  reports that he has never smoked. He has never used smokeless tobacco.   Family History:  The patient's family history includes Heart failure in his mother; Stroke in his father.    ROS:  Please see the history of present illness.     Patient denies fever, chills, headache, sweats, rash, change in vision, change in hearing, chest pain, cough, nausea or vomiting, urinary symptoms. All other systems are reviewed and are negative.   PHYSICAL EXAM: VS:  BP 160/84 mmHg  Pulse 50  Ht 6\' 1"  (1.854 m)  Wt 232 lb (105.235 kg)  BMI 30.62 kg/m2  SpO2 98% , Patient is oriented to person time and place. Affect is normal. Head is atraumatic. Sclera and conjunctiva are normal. There is no jugulovenous distention. Lungs are clear. Respiratory effort is nonlabored. Cardiac exam reveals S1 and S2. The rhythm is regular. Abdomen is soft. There is no peripheral edema.  EKG:   EKG is done today and reviewed by me. He has significant sinus bradycardia.   Recent Labs: No results found for requested labs within last 365 days.    Lipid Panel No results found for: CHOL, TRIG, HDL, CHOLHDL, VLDL, LDLCALC, LDLDIRECT    Wt Readings from Last 3 Encounters:  04/09/14 232 lb (105.235 kg)  01/31/14 235 lb (106.595 kg)  08/05/13 247 lb  11.2 oz (112.356 kg)      Current medicines are reviewed. He understands his medications and the changes we are making today.       ASSESSMENT AND PLAN:

## 2014-04-09 NOTE — Assessment & Plan Note (Signed)
He has significant sinus bradycardia. Ultimately we may decrease his beta blocker dose. However, for now, watch his rate as we switch his diltiazem to amlodipine for better blood pressure control.

## 2014-05-01 ENCOUNTER — Telehealth: Payer: Self-pay | Admitting: *Deleted

## 2014-05-01 ENCOUNTER — Encounter: Payer: Self-pay | Admitting: Cardiology

## 2014-05-01 MED ORDER — FLECAINIDE ACETATE 150 MG PO TABS: 150.0000 mg | ORAL_TABLET | Freq: Two times a day (BID) | ORAL | Status: DC

## 2014-05-01 NOTE — Telephone Encounter (Signed)
Flecainide refill sent to pharmacy 

## 2014-05-21 ENCOUNTER — Encounter: Payer: Self-pay | Admitting: Cardiology

## 2014-05-21 ENCOUNTER — Ambulatory Visit (INDEPENDENT_AMBULATORY_CARE_PROVIDER_SITE_OTHER): Payer: BLUE CROSS/BLUE SHIELD | Admitting: Cardiology

## 2014-05-21 VITALS — BP 163/82 | HR 59 | Ht 73.0 in | Wt 234.8 lb

## 2014-05-21 DIAGNOSIS — I10 Essential (primary) hypertension: Secondary | ICD-10-CM | POA: Diagnosis not present

## 2014-05-21 DIAGNOSIS — I480 Paroxysmal atrial fibrillation: Secondary | ICD-10-CM | POA: Diagnosis not present

## 2014-05-21 MED ORDER — AMLODIPINE BESYLATE 10 MG PO TABS: 10.0000 mg | ORAL_TABLET | Freq: Every day | ORAL | Status: DC

## 2014-05-21 NOTE — Assessment & Plan Note (Signed)
Systolic blood pressure still elevated. We will increase the amlodipine and see how he does.

## 2014-05-21 NOTE — Patient Instructions (Signed)
Your physician has recommended you make the following change in your medication:  Increase amlodipine to 10 mg daily. You may take (2) of your 5 mg tablets daily until they are finished. Continue all other medications the same. Your physician recommends that you schedule a follow-up appointment in: 6 weeks with Dr. Ron Parker.

## 2014-05-21 NOTE — Assessment & Plan Note (Signed)
He remains anticoagulated. We had stopped diltiazem because he felt that the preparation spelled poorly. His resting heart rate has come up and he actually feels better. However he has noted more episodes of increased heart rate. He notices this more when checking his blood pressure then actually having symptoms.

## 2014-05-21 NOTE — Progress Notes (Signed)
Cardiology Office Note   Date:  05/21/2014   ID:  Nathan Sutton, DOB 12/31/1950, MRN [id number]  PCP:  Nathan Hilding, MD  Cardiologist:  Dola Argyle, MD   Chief Complaint  Patient presents with  . Appointment    follow-up atrial fibrillation and hypertension      History of Present Illness: Nathan Sutton is a 64 y.o. male who presents today to follow-up atrial fibrillation. He did not like taking diltiazem. He felt that one of the preparations had a bad odor. Since his rhythm had been stable, and his blood pressure was elevated, I decided to stop diltiazem and add amlodipine. He's tolerating amlodipine. His blood pressure is under better control but not optimal control. However he is recording episodes of increased heart rate. I suspect that he is having paroxysms of atrial fibrillation. He is not having any significant symptoms.  He had multiple other questions for me today including whether he should have sleep apnea evaluation. I strongly encouraged him to do this.    Past Medical History  Diagnosis Date  . Diabetes mellitus   . Dyslipidemia   . Hypertension   . Overweight(278.02)   . Atrial fibrillation   . Heart murmur     As a Child  . Ejection fraction     EF 55-60%, echo, May, 2014    Past Surgical History  Procedure Laterality Date  . Back surgery  2011/08    Patient Active Problem List   Diagnosis Date Noted  . Chronic anticoagulation 04/09/2014  . Bradycardia 03/05/2013  . Altered taste 09/21/2012  . Normal cardiac stress test 08/16/2012  . Ejection fraction   . Diabetes mellitus   . Dyslipidemia   . Hypertension   . Overweight(278.02)   . Atrial fibrillation       Current Outpatient Prescriptions  Medication Sig Dispense Refill  . apixaban (ELIQUIS) 5 MG TABS tablet Take 5 mg by mouth 2 (two) times daily.    Marland Kitchen atorvastatin (LIPITOR) 10 MG tablet Take by mouth. 0.5 Tablets 3 days a week    . flecainide (TAMBOCOR) 150 MG tablet Take 1 tablet  (150 mg total) by mouth 2 (two) times daily. 180 tablet 3  . lisinopril-hydrochlorothiazide (PRINZIDE,ZESTORETIC) 20-12.5 MG per tablet Take 2 tablets by mouth daily.     . metFORMIN (GLUCOPHAGE) 500 MG tablet Take 1,500 mg by mouth daily. 1500 mg daily    . metoprolol (TOPROL-XL) 200 MG 24 hr tablet Take 200 mg by mouth daily.    Marland Kitchen omeprazole (PRILOSEC) 20 MG capsule Take 20 mg by mouth daily.    Marland Kitchen amLODipine (NORVASC) 10 MG tablet Take 1 tablet (10 mg total) by mouth daily. 90 tablet 3  . cyclobenzaprine (FLEXERIL) 10 MG tablet Take 10 mg by mouth as needed for muscle spasms.     No current facility-administered medications for this visit.    Allergies:   Review of patient's allergies indicates no known allergies.    Social History:  The patient  reports that he has never smoked. He has never used smokeless tobacco.   Family History:  The patient's family history includes Heart failure in his mother; Stroke in his father.    ROS:  Please see the history of present illness. Patient denies fever, chills, headache, sweats, rash, change in vision, change in hearing, chest pain, cough, nausea or vomiting, urinary symptoms. All other systems are reviewed and are negative.     PHYSICAL EXAM: VS:  BP 163/82 mmHg  Pulse 59  Ht 6\' 1"  (1.854 m)  Wt 234 lb 12.8 oz (106.505 kg)  BMI 30.98 kg/m2  SpO2 96% , Patient is oriented to person time and place. Affect is normal. Head is atraumatic. Sclera conjunctiva are normal. There is no jugulovenous distention. Lungs are clear. Respiratory effort is nonlabored. Cardiac exam reveals an S1 and S2. Abdomen is soft. There is no peripheral edema. There are no musculoskeletal deformities. There are no skin rashes. The rhythm is regular. Neurologic is grossly intact.  EKG:   EKG is not done today.   Recent Labs: No results found for requested labs within last 365 days.    Lipid Panel No results found for: CHOL, TRIG, HDL, CHOLHDL, VLDL, LDLCALC,  LDLDIRECT    Wt Readings from Last 3 Encounters:  05/21/14 234 lb 12.8 oz (106.505 kg)  04/09/14 232 lb (105.235 kg)  01/31/14 235 lb (106.595 kg)      Current medicines are reviewed  The patient understands his medications.     ASSESSMENT AND PLAN:

## 2014-07-02 ENCOUNTER — Ambulatory Visit (INDEPENDENT_AMBULATORY_CARE_PROVIDER_SITE_OTHER): Payer: BLUE CROSS/BLUE SHIELD | Admitting: Cardiology

## 2014-07-02 ENCOUNTER — Encounter: Payer: Self-pay | Admitting: Cardiology

## 2014-07-02 VITALS — BP 150/82 | HR 64 | Ht 73.0 in | Wt 237.0 lb

## 2014-07-02 DIAGNOSIS — I480 Paroxysmal atrial fibrillation: Secondary | ICD-10-CM | POA: Diagnosis not present

## 2014-07-02 DIAGNOSIS — I10 Essential (primary) hypertension: Secondary | ICD-10-CM

## 2014-07-02 NOTE — Progress Notes (Signed)
Cardiology Office Note   Date:  07/02/2014   ID:  Nathan Sutton, DOB [birth year], MRN [id number]  PCP:  Nathan Hilding, MD  Cardiologist:  Dola Argyle, MD   Chief Complaint  Patient presents with  . Appointment    Follow-up atrial fibrillation and hypertension      History of Present Illness: Nathan Sutton is a 64 y.o. male who presents today to follow-up atrial fibrillation and hypertension. When I saw him on May 21, 2014 I decided to increase amlodipine for better blood pressure control. He is tolerating 10 mg along with his other medications. He feels well. His pressure is under good control. He shows me multiple blood pressure checks from home that are in a good range.    Past Medical History  Diagnosis Date  . Diabetes mellitus   . Dyslipidemia   . Hypertension   . Overweight(278.02)   . Atrial fibrillation   . Heart murmur     As a Child  . Ejection fraction     EF 55-60%, echo, May, 2014    Past Surgical History  Procedure Laterality Date  . Back surgery  2011/08    Patient Active Problem List   Diagnosis Date Noted  . Chronic anticoagulation 04/09/2014  . Bradycardia 03/05/2013  . Altered taste 09/21/2012  . Normal cardiac stress test 08/16/2012  . Ejection fraction   . Diabetes mellitus   . Dyslipidemia   . Essential hypertension   . Overweight(278.02)   . Atrial fibrillation       Current Outpatient Prescriptions  Medication Sig Dispense Refill  . amLODipine (NORVASC) 10 MG tablet Take 1 tablet (10 mg total) by mouth daily. 90 tablet 3  . apixaban (ELIQUIS) 5 MG TABS tablet Take 5 mg by mouth 2 (two) times daily.    Marland Kitchen atorvastatin (LIPITOR) 10 MG tablet Take by mouth. 0.5 Tablets 3 days a week    . cyclobenzaprine (FLEXERIL) 10 MG tablet Take 10 mg by mouth as needed for muscle spasms.    . flecainide (TAMBOCOR) 150 MG tablet Take 1 tablet (150 mg total) by mouth 2 (two) times daily. 180 tablet 3  . lisinopril-hydrochlorothiazide  (PRINZIDE,ZESTORETIC) 20-12.5 MG per tablet Take 2 tablets by mouth daily.     . metFORMIN (GLUCOPHAGE) 500 MG tablet Take 1,500 mg by mouth daily. 1500 mg daily    . metoprolol (TOPROL-XL) 200 MG 24 hr tablet Take 200 mg by mouth daily.    Marland Kitchen omeprazole (PRILOSEC) 20 MG capsule Take 20 mg by mouth daily.     No current facility-administered medications for this visit.    Allergies:   Review of patient's allergies indicates no known allergies.    Social History:  The patient  reports that he has never smoked. He has never used smokeless tobacco.   Family History:  The patient's family history includes Heart failure in his mother; Stroke in his father.    ROS:  Please see the history of present illness.     Patient denies fever, chills, headache, sweats, rash, change in vision, change in hearing, chest pain, cough, nausea vomiting, urinary symptoms. All other systems are reviewed and are negative.   PHYSICAL EXAM: VS:  BP 150/82 mmHg  Pulse 64  Ht 6\' 1"  (1.854 m)  Wt 237 lb (107.502 kg)  BMI 31.27 kg/m2  SpO2 98% , Patient is stable today. He is oriented to person time and place. Affect is normal. Head is atraumatic. Sclera and conjunctiva are  normal. There is no jugular venous distention. Lungs are clear. Respiratory effort is nonlabored. Cardiac exam reveals S1 and S2. The rhythm is regular. Abdomen is soft. There is no peripheral edema. There are no musculoskeletal deformities. There are no skin rashes.  EKG:   EKG is not done today.   Recent Labs: No results found for requested labs within last 365 days.    Lipid Panel No results found for: CHOL, TRIG, HDL, CHOLHDL, VLDL, LDLCALC, LDLDIRECT    Wt Readings from Last 3 Encounters:  07/02/14 237 lb (107.502 kg)  05/21/14 234 lb 12.8 oz (106.505 kg)  04/09/14 232 lb (105.235 kg)      Current medicines are reviewed  Patient understands his medications well.   ASSESSMENT AND PLAN:

## 2014-07-02 NOTE — Assessment & Plan Note (Signed)
Blood pressure is now well controlled on his current medications. No change in therapy.

## 2014-07-02 NOTE — Assessment & Plan Note (Signed)
He continues to hold sinus rhythm.  No change in therapy. 

## 2014-07-02 NOTE — Patient Instructions (Signed)
Medication Instructions:  Your physician recommends that you continue on your current medications as directed. Please refer to the Current Medication list given to you today.   Labwork: NONE  Testing/Procedures: NONE  Follow-Up: 09/22/14 WITH DR. KATZ  Any Other Special Instructions Will Be Listed Below (If Applicable).

## 2014-09-22 ENCOUNTER — Encounter: Payer: Self-pay | Admitting: Cardiology

## 2014-09-22 ENCOUNTER — Ambulatory Visit (INDEPENDENT_AMBULATORY_CARE_PROVIDER_SITE_OTHER): Payer: BLUE CROSS/BLUE SHIELD | Admitting: Cardiology

## 2014-09-22 VITALS — BP 140/78 | HR 66 | Ht 73.0 in | Wt 242.0 lb

## 2014-09-22 DIAGNOSIS — Z7901 Long term (current) use of anticoagulants: Secondary | ICD-10-CM

## 2014-09-22 DIAGNOSIS — Z136 Encounter for screening for cardiovascular disorders: Secondary | ICD-10-CM

## 2014-09-22 DIAGNOSIS — R600 Localized edema: Secondary | ICD-10-CM | POA: Insufficient documentation

## 2014-09-22 DIAGNOSIS — I480 Paroxysmal atrial fibrillation: Secondary | ICD-10-CM | POA: Diagnosis not present

## 2014-09-22 DIAGNOSIS — I10 Essential (primary) hypertension: Secondary | ICD-10-CM

## 2014-09-22 NOTE — Progress Notes (Signed)
Cardiology Office Note   Date:  09/22/2014   ID:  Nathan Sutton, DOB Nov 10, 1950, MRN [id number]  PCP:  Manon Hilding, MD  Cardiologist:  Dola Argyle, MD   Chief Complaint  Patient presents with  . Appointment    Follow-up atrial fibrillation      History of Present Illness: Nathan Sutton is a 64 y.o. male who presents today to follow-up paroxysmal atrial fibrillation and hypertension. He is feeling well. His blood pressure is well-controlled. He is tolerating amlodipine. He is not having any significant palpitations. He does have slight edema. I believe this is related to a combination of excess salt and fluid intake along with his amlodipine.    Past Medical History  Diagnosis Date  . Diabetes mellitus   . Dyslipidemia   . Hypertension   . Overweight(278.02)   . Atrial fibrillation   . Heart murmur     As a Child  . Ejection fraction     EF 55-60%, echo, May, 2014    Past Surgical History  Procedure Laterality Date  . Back surgery  2011/08    Patient Active Problem List   Diagnosis Date Noted  . Chronic anticoagulation 04/09/2014  . Bradycardia 03/05/2013  . Altered taste 09/21/2012  . Normal cardiac stress test 08/16/2012  . Ejection fraction   . Diabetes mellitus   . Dyslipidemia   . Essential hypertension   . Overweight(278.02)   . Atrial fibrillation       Current Outpatient Prescriptions  Medication Sig Dispense Refill  . amLODipine (NORVASC) 10 MG tablet Take 1 tablet (10 mg total) by mouth daily. 90 tablet 3  . apixaban (ELIQUIS) 5 MG TABS tablet Take 5 mg by mouth 2 (two) times daily.    Marland Kitchen atorvastatin (LIPITOR) 10 MG tablet Take by mouth. 0.5 Tablets 3 days a week    . cyclobenzaprine (FLEXERIL) 10 MG tablet Take 10 mg by mouth as needed for muscle spasms.    . flecainide (TAMBOCOR) 150 MG tablet Take 1 tablet (150 mg total) by mouth 2 (two) times daily. 180 tablet 3  . lisinopril-hydrochlorothiazide (PRINZIDE,ZESTORETIC) 20-12.5 MG per  tablet Take 2 tablets by mouth daily.     . metFORMIN (GLUCOPHAGE) 500 MG tablet Take 1,500 mg by mouth daily. 1500 mg daily    . metoprolol (TOPROL-XL) 200 MG 24 hr tablet Take 200 mg by mouth daily.    Marland Kitchen omeprazole (PRILOSEC) 20 MG capsule Take 20 mg by mouth daily.     No current facility-administered medications for this visit.    Allergies:   Review of patient's allergies indicates no known allergies.    Social History:  The patient  reports that he has never smoked. He has never used smokeless tobacco.   Family History:  The patient's family history includes Heart failure in his mother; Stroke in his father.    ROS:  Please see the history of present illness.    Patient denies fever, chills, headache, sweats, rash, change in vision, change in hearing, chest pain, cough, nausea or vomiting, urinary symptoms. All other systems are reviewed and are negative.    PHYSICAL EXAM: VS:  BP 140/78 mmHg  Pulse 66  Ht 6\' 1"  (1.854 m)  Wt 242 lb (109.77 kg)  BMI 31.93 kg/m2  SpO2 97% , Patient is oriented to person time and place. Affect is normal. Head is atraumatic. Sclera and conjunctiva are normal. There is no jugular venous distention. Lungs are clear. Respiratory effort is  nonlabored. Cardiac exam reveals an S1 and S2. The rhythm is regular. Abdomen is soft. There is trace swelling in his feet. There are no musculoskeletal deformities. There are no skin rashes. EKG is   EKG:   EKG is not done today.   Recent Labs: No results found for requested labs within last 365 days.    Lipid Panel No results found for: CHOL, TRIG, HDL, CHOLHDL, VLDL, LDLCALC, LDLDIRECT    Wt Readings from Last 3 Encounters:  09/22/14 242 lb (109.77 kg)  07/02/14 237 lb (107.502 kg)  05/21/14 234 lb 12.8 oz (106.505 kg)      Current medicines are reviewed  The patient understands his medications.     ASSESSMENT AND PLAN:

## 2014-09-22 NOTE — Patient Instructions (Signed)
Continue all current medications. Follow up in  3 months 

## 2014-09-22 NOTE — Assessment & Plan Note (Signed)
Blood pressure is well controlled on his current regimen. No change in therapy.

## 2014-09-22 NOTE — Assessment & Plan Note (Signed)
His atrial fib is controlled. Flecainide will be continued. He is anticoagulated. No further workup.

## 2014-09-22 NOTE — Assessment & Plan Note (Signed)
He has slight pedal edema. I feel that this is from a combination of excess salt, excess fluid, and amlodipine. He will cut back his salt and fluid intake. If he has any worsening symptoms he will be in touch with [place]. He is on a small dose of diuretic

## 2014-09-22 NOTE — Assessment & Plan Note (Signed)
Anticoagulation will be continued. No change in therapy.

## 2014-12-22 ENCOUNTER — Encounter: Payer: Self-pay | Admitting: Cardiovascular Disease

## 2014-12-22 ENCOUNTER — Ambulatory Visit (INDEPENDENT_AMBULATORY_CARE_PROVIDER_SITE_OTHER): Payer: BLUE CROSS/BLUE SHIELD | Admitting: Cardiovascular Disease

## 2014-12-22 VITALS — BP 148/81 | HR 62 | Ht 73.0 in | Wt 247.0 lb

## 2014-12-22 DIAGNOSIS — I10 Essential (primary) hypertension: Secondary | ICD-10-CM

## 2014-12-22 DIAGNOSIS — Z7901 Long term (current) use of anticoagulants: Secondary | ICD-10-CM

## 2014-12-22 DIAGNOSIS — I480 Paroxysmal atrial fibrillation: Secondary | ICD-10-CM

## 2014-12-22 NOTE — Progress Notes (Signed)
Patient ID: Nathan Sutton, male   DOB: Jun 02, 1950, 64 y.o.   MRN: [id number]      SUBJECTIVE: The patient presents for follow-up of paroxysmal atrial fibrillation and essential hypertension. He is anticoagulated with apixaban. He used to see Dr. Ron Parker.   He has been doing well. He denies chest pain and shortness of breath and seldom has palpitations. He monitors his blood pressure, heart rate, and blood sugar quite regularly. He recently broke his right fifth metatarsal and is in a boot and walks with a walker. He denies bleeding problems. He is frustrated by a 6 pound weight gain due to the inability to exercise.     Review of Systems: As per "subjective", otherwise negative.  No Known Allergies  Current Outpatient Prescriptions  Medication Sig Dispense Refill  . amLODipine (NORVASC) 10 MG tablet Take 1 tablet (10 mg total) by mouth daily. 90 tablet 3  . apixaban (ELIQUIS) 5 MG TABS tablet Take 5 mg by mouth 2 (two) times daily.    Marland Kitchen atorvastatin (LIPITOR) 10 MG tablet Take by mouth. 0.5 Tablets 3 days a week    . cyclobenzaprine (FLEXERIL) 10 MG tablet Take 10 mg by mouth as needed for muscle spasms.    . flecainide (TAMBOCOR) 150 MG tablet Take 1 tablet (150 mg total) by mouth 2 (two) times daily. 180 tablet 3  . gabapentin (NEURONTIN) 100 MG capsule Take 1 capsule by mouth 3 (three) times daily.  0  . lisinopril-hydrochlorothiazide (PRINZIDE,ZESTORETIC) 20-12.5 MG per tablet Take 2 tablets by mouth daily.     Marland Kitchen MELATONIN PO Take 1 tablet by mouth at bedtime.    . metFORMIN (GLUCOPHAGE-XR) 500 MG 24 hr tablet Take 2 tablets by mouth 2 (two) times daily.  1  . metoprolol (TOPROL-XL) 200 MG 24 hr tablet Take 200 mg by mouth daily.    Marland Kitchen omeprazole (PRILOSEC) 20 MG capsule Take 20 mg by mouth daily.     No current facility-administered medications for this visit.    Past Medical History  Diagnosis Date  . Diabetes mellitus ([place])   . Dyslipidemia   . Hypertension   .  Overweight(278.02)   . Atrial fibrillation ([place])   . Heart murmur     As a Child  . Ejection fraction     EF 55-60%, echo, May, 2014    Past Surgical History  Procedure Laterality Date  . Back surgery  2011/08    Social History   Social History  . Marital Status: Married    Spouse Name: N/A  . Number of Children: N/A  . Years of Education: N/A   Occupational History  . Not on file.   Social History Main Topics  . Smoking status: Never Smoker   . Smokeless tobacco: Never Used  . Alcohol Use: Not on file  . Drug Use: Not on file  . Sexual Activity: Not on file   Other Topics Concern  . Not on file   Social History Narrative     Filed Vitals:   12/22/14 0951  BP: 148/81  Pulse: 62  Height: 6\' 1"  (1.854 m)  Weight: 247 lb (112.038 kg)    PHYSICAL EXAM General: NAD HEENT: Normal. Neck: No JVD, no thyromegaly. Lungs: Clear to auscultation bilaterally with normal respiratory effort. CV: Nondisplaced PMI.  Regular rate and rhythm, normal S1/S2, no XX123456, 1/6 systolic murmur over RUSB. Trivial pretibial and dorsal pedal edema.  No carotid bruit.   Abdomen: Obese. Neurologic: Alert and oriented x  3.  Psych: Normal affect. Skin: Normal. Musculoskeletal: Right foot in boot. Extremities: No clubbing or cyanosis.   ECG: Most recent ECG reviewed.      ASSESSMENT AND PLAN: 1. Paroxysmal atrial fibrillation: Symptomatically stable on metoprolol and apixaban. No changes.  2. Essential HTN: Mildly elevated today on amlodipine 10 mg, lisinopril -hydrochlorothiazide 40-25 mg daily, and Toprol-XL 200 mg daily, but he has been having headaches and a sinus infection for the past 2 days. Given his obesity, I have recommended weight loss via dietary modification.  Dispo: f/u 6 months.  Kate Sable, M.D., F.A.C.C.

## 2014-12-22 NOTE — Patient Instructions (Signed)
Continue all current medications. Your physician wants you to follow up in: 6 months.  You will receive a reminder letter in the mail one-two months in advance.  If you don't receive a letter, please call our office to schedule the follow up appointment   

## 2015-01-21 ENCOUNTER — Encounter: Payer: Self-pay | Admitting: Cardiovascular Disease

## 2015-01-21 NOTE — Telephone Encounter (Signed)
Call placed to patient regarding questions about his foot surgery.  Explained to patient that typically we have the surgeon send [place] a cardiac clearance request so that we know exactly what procedure they are doing & specific meds they want to hold.  Patient verbalized understanding.  Office fax number given.

## 2015-01-21 NOTE — Telephone Encounter (Signed)
Also, explained that our doctors do not go to [facility] anymore.  That does not mean that he could not have something done there though.  The advantage to having care at any of our [facility] facilities is that all docs can see the same information which allows for better continuity of care.

## 2015-02-05 ENCOUNTER — Encounter: Payer: Self-pay | Admitting: Cardiovascular Disease

## 2015-02-05 ENCOUNTER — Other Ambulatory Visit: Payer: Self-pay | Admitting: *Deleted

## 2015-02-05 MED ORDER — APIXABAN 5 MG PO TABS: 5.0000 mg | ORAL_TABLET | Freq: Two times a day (BID) | ORAL | Status: DC

## 2015-02-05 MED ORDER — FLECAINIDE ACETATE 150 MG PO TABS: 150.0000 mg | ORAL_TABLET | Freq: Two times a day (BID) | ORAL | Status: DC

## 2015-05-20 ENCOUNTER — Other Ambulatory Visit: Payer: Self-pay | Admitting: Cardiology

## 2015-06-09 ENCOUNTER — Encounter: Payer: Self-pay | Admitting: Cardiovascular Disease

## 2015-06-09 ENCOUNTER — Ambulatory Visit (INDEPENDENT_AMBULATORY_CARE_PROVIDER_SITE_OTHER): Payer: BLUE CROSS/BLUE SHIELD | Admitting: Cardiovascular Disease

## 2015-06-09 VITALS — BP 156/84 | HR 62 | Ht 73.0 in | Wt 244.0 lb

## 2015-06-09 DIAGNOSIS — I480 Paroxysmal atrial fibrillation: Secondary | ICD-10-CM

## 2015-06-09 DIAGNOSIS — R5383 Other fatigue: Secondary | ICD-10-CM | POA: Diagnosis not present

## 2015-06-09 DIAGNOSIS — R600 Localized edema: Secondary | ICD-10-CM

## 2015-06-09 DIAGNOSIS — I10 Essential (primary) hypertension: Secondary | ICD-10-CM

## 2015-06-09 DIAGNOSIS — Z7901 Long term (current) use of anticoagulants: Secondary | ICD-10-CM

## 2015-06-09 NOTE — Progress Notes (Signed)
Patient ID: Nathan Sutton, male   DOB: 08/30/1950, 65 y.o.   MRN: [id number]      SUBJECTIVE: The patient presents for follow-up of paroxysmal atrial fibrillation and essential hypertension. He is anticoagulated with apixaban.  He has been doing well. He denies chest pain and shortness of breath and seldom has palpitations. He monitors his blood pressure, heart rate, and blood sugar quite regularly.  He had several questions regarding his health. He has had some recent bilateral feet swelling. He also has neuropathy and takes gabapentin. Higher doses made him feel drowsy. He falls asleep at times during the day and while we talked about testing for sleep apnea, he does not want to bear the burden of the cost. He denies consistent exertional chest tightness or dyspnea. He does not exercise.   Review of Systems: As per "subjective", otherwise negative.  No Known Allergies  Current Outpatient Prescriptions  Medication Sig Dispense Refill  . amLODipine (NORVASC) 10 MG tablet TAKE ONE TABLET BY MOUTH ONCE DAILY 90 tablet 3  . apixaban (ELIQUIS) 5 MG TABS tablet Take 1 tablet (5 mg total) by mouth 2 (two) times daily. 60 tablet 6  . atorvastatin (LIPITOR) 10 MG tablet Take by mouth. 0.5 Tablets 3 days a week    . cyclobenzaprine (FLEXERIL) 10 MG tablet Take 10 mg by mouth as needed for muscle spasms.    . flecainide (TAMBOCOR) 150 MG tablet Take 1 tablet (150 mg total) by mouth 2 (two) times daily. 60 tablet 6  . gabapentin (NEURONTIN) 100 MG capsule Take 1 capsule by mouth 3 (three) times daily.  0  . lisinopril-hydrochlorothiazide (PRINZIDE,ZESTORETIC) 20-12.5 MG per tablet Take 2 tablets by mouth daily.     Marland Kitchen MELATONIN PO Take 1 tablet by mouth at bedtime.    . metFORMIN (GLUCOPHAGE-XR) 500 MG 24 hr tablet Take 2 tablets by mouth 2 (two) times daily.  1  . metoprolol (TOPROL-XL) 200 MG 24 hr tablet Take 200 mg by mouth daily.    . Multiple Vitamin (MULTIVITAMIN) tablet Take 1 tablet by mouth  daily.    Marland Kitchen omeprazole (PRILOSEC) 20 MG capsule Take 20 mg by mouth daily.     No current facility-administered medications for this visit.    Past Medical History  Diagnosis Date  . Diabetes mellitus ([place])   . Dyslipidemia   . Hypertension   . Overweight(278.02)   . Atrial fibrillation ([place])   . Heart murmur     As a Child  . Ejection fraction     EF 55-60%, echo, May, 2014    Past Surgical History  Procedure Laterality Date  . Back surgery  2011/08    Social History   Social History  . Marital Status: Married    Spouse Name: N/A  . Number of Children: N/A  . Years of Education: N/A   Occupational History  . Not on file.   Social History Main Topics  . Smoking status: Never Smoker   . Smokeless tobacco: Never Used  . Alcohol Use: Not on file  . Drug Use: Not on file  . Sexual Activity: Not on file   Other Topics Concern  . Not on file   Social History Narrative     Filed Vitals:   06/09/15 1114  BP: 156/84  Pulse: 62  Height: 6\' 1"  (1.854 m)  Weight: 244 lb (110.678 kg)    PHYSICAL EXAM General: NAD HEENT: Normal. Neck: No JVD, no thyromegaly. Lungs: Clear to auscultation bilaterally  with normal respiratory effort. CV: Nondisplaced PMI. Regular rate and rhythm, normal S1/S2, no XX123456, 1/6 systolic murmur over RUSB. Trivial pretibial and dorsal pedal edema.  Abdomen: Obese. Neurologic: Alert and oriented x 3.  Psych: Normal affect. Skin: Normal. Extremities: No clubbing or cyanosis.     ECG: Most recent ECG reviewed.      ASSESSMENT AND PLAN: 1. Paroxysmal atrial fibrillation: Symptomatically stable on metoprolol and apixaban. No changes.  2. Essential HTN: Remains elevated today on amlodipine 10 mg, lisinopril -hydrochlorothiazide 40-25 mg daily, and Toprol-XL 200 mg daily. Given his obesity, I have recommended weight loss via dietary modification. Has lost 3 lbs since last visit.  3. Obesity: Given his obesity, I have recommended  weight loss via dietary modification. Has lost 3 lbs since last visit.  4. Bilateral leg edema: Have recommended compression stockings.  5. Sleep apnea/fatigue: I have recommended a sleep study. He does not want to bear the burden of the cost at this time.   Dispo: f/u 1 year.   Kate Sable, M.D., F.A.C.C.

## 2015-06-09 NOTE — Patient Instructions (Signed)
Continue all current medications. Your physician wants you to follow up in:  1 year.  You will receive a reminder letter in the mail one-two months in advance.  If you don't receive a letter, please call our office to schedule the follow up appointment   

## 2015-06-15 ENCOUNTER — Ambulatory Visit: Payer: BLUE CROSS/BLUE SHIELD | Admitting: Cardiovascular Disease

## 2015-08-26 ENCOUNTER — Other Ambulatory Visit: Payer: Self-pay | Admitting: Cardiovascular Disease

## 2015-12-22 ENCOUNTER — Other Ambulatory Visit: Payer: Self-pay | Admitting: Cardiovascular Disease

## 2016-05-18 ENCOUNTER — Other Ambulatory Visit: Payer: Self-pay | Admitting: *Deleted

## 2016-05-18 MED ORDER — AMLODIPINE BESYLATE 10 MG PO TABS: 10.0000 mg | ORAL_TABLET | Freq: Every day | ORAL | 3 refills | Status: DC

## 2016-05-20 DIAGNOSIS — J019 Acute sinusitis, unspecified: Secondary | ICD-10-CM | POA: Diagnosis not present

## 2016-05-20 DIAGNOSIS — R05 Cough: Secondary | ICD-10-CM | POA: Diagnosis not present

## 2016-05-20 DIAGNOSIS — Z6831 Body mass index (BMI) 31.0-31.9, adult: Secondary | ICD-10-CM | POA: Diagnosis not present

## 2016-05-25 ENCOUNTER — Other Ambulatory Visit: Payer: Self-pay | Admitting: *Deleted

## 2016-05-25 MED ORDER — FLECAINIDE ACETATE 150 MG PO TABS: 150.0000 mg | ORAL_TABLET | Freq: Two times a day (BID) | ORAL | 0 refills | Status: DC

## 2016-05-27 ENCOUNTER — Other Ambulatory Visit: Payer: Self-pay | Admitting: *Deleted

## 2016-05-27 MED ORDER — AMLODIPINE BESYLATE 10 MG PO TABS: 10.0000 mg | ORAL_TABLET | Freq: Every day | ORAL | 3 refills | Status: DC

## 2016-06-08 ENCOUNTER — Encounter: Payer: Self-pay | Admitting: Cardiovascular Disease

## 2016-06-08 ENCOUNTER — Ambulatory Visit (INDEPENDENT_AMBULATORY_CARE_PROVIDER_SITE_OTHER): Payer: Medicare Other | Admitting: Cardiovascular Disease

## 2016-06-08 VITALS — BP 116/83 | HR 110 | Ht 73.0 in | Wt 229.0 lb

## 2016-06-08 DIAGNOSIS — E785 Hyperlipidemia, unspecified: Secondary | ICD-10-CM | POA: Diagnosis not present

## 2016-06-08 DIAGNOSIS — I480 Paroxysmal atrial fibrillation: Secondary | ICD-10-CM

## 2016-06-08 DIAGNOSIS — I10 Essential (primary) hypertension: Secondary | ICD-10-CM | POA: Diagnosis not present

## 2016-06-08 DIAGNOSIS — I4892 Unspecified atrial flutter: Secondary | ICD-10-CM | POA: Diagnosis not present

## 2016-06-08 NOTE — Patient Instructions (Signed)
Medication Instructions:  Your physician recommends that you continue on your current medications as directed. Please refer to the Current Medication list given to you today.  Labwork: NONE  Testing/Procedures: Your physician has requested that you have an echocardiogram. Echocardiography is a painless test that uses sound waves to create images of your heart. It provides your doctor with information about the size and shape of your heart and how well your heart's chambers and valves are working. This procedure takes approximately one hour. There are no restrictions for this procedure.  Follow-Up: Your physician wants you to follow-up in: [place]. [place]. You will receive a reminder letter in the mail two months in advance. If you don't receive a letter, please call our office to schedule the follow-up appointment.  Any Other Special Instructions Will Be Listed Below (If Applicable).  You have been referred to [place]   If you need a refill on your cardiac medications before your next appointment, please call your pharmacy.

## 2016-06-08 NOTE — Progress Notes (Signed)
SUBJECTIVE: The patient presents for follow-up of paroxysmal atrial fibrillation and essential hypertension. He is anticoagulated with apixaban.  He has lost 15 pounds since his last visit.  He currently denies palpitations. He said he feels a little anxious. He denies chest pain and shortness of breath. He wrote out a list of symptoms which I reviewed which happen sporadically. While watching TV he has episodic shortness of breath lasting less than a minute. This last occurred on Easter Sunday. It also occurs when he does not get enough sleep. He monitors his heart rate which has gotten as high as 154 bpm.  Echocardiogram 05/31/12 showed normal left ventricular systolic function, LVEF 47-82%, with mild left atrial enlargement.  ECG performed in the office today which I ordered an personally interpreted demonstrated atrial flutter with variable conduction, 103 bpm.   Review of Systems: As per "subjective", otherwise negative.  No Known Allergies  Current Outpatient Prescriptions  Medication Sig Dispense Refill  . amLODipine (NORVASC) 10 MG tablet Take 1 tablet (10 mg total) by mouth daily. 90 tablet 3  . apixaban (ELIQUIS) 5 MG TABS tablet Take 1 tablet (5 mg total) by mouth 2 (two) times daily. 60 tablet 6  . atorvastatin (LIPITOR) 10 MG tablet Take by mouth. 0.5 Tablets 3 days a week    . cyclobenzaprine (FLEXERIL) 10 MG tablet Take 10 mg by mouth as needed for muscle spasms.    . flecainide (TAMBOCOR) 150 MG tablet Take 1 tablet (150 mg total) by mouth 2 (two) times daily. 180 tablet 0  . lisinopril-hydrochlorothiazide (PRINZIDE,ZESTORETIC) 20-12.5 MG per tablet Take 2 tablets by mouth daily.     . metFORMIN (GLUCOPHAGE-XR) 500 MG 24 hr tablet Take 2 tablets by mouth 2 (two) times daily.  1  . metoprolol (TOPROL-XL) 200 MG 24 hr tablet Take 200 mg by mouth daily.    . ranitidine (ZANTAC) 150 MG tablet Take 150 mg by mouth 2 (two) times daily.     No current  facility-administered medications for this visit.     Past Medical History:  Diagnosis Date  . Atrial fibrillation ([place])   . Diabetes mellitus ([place])   . Dyslipidemia   . Ejection fraction    EF 55-60%, echo, May, 2014  . Heart murmur    As a Child  . Hypertension   . Overweight(278.02)     Past Surgical History:  Procedure Laterality Date  . BACK SURGERY  2011/08    Social History   Social History  . Marital status: Married    Spouse name: N/A  . Number of children: N/A  . Years of education: N/A   Occupational History  . Not on file.   Social History Main Topics  . Smoking status: Never Smoker  . Smokeless tobacco: Never Used  . Alcohol use Not on file  . Drug use: Unknown  . Sexual activity: Not on file   Other Topics Concern  . Not on file   Social History Narrative  . No narrative on file     Vitals:   06/08/16 1055  BP: 116/83  Pulse: (!) 110  SpO2: 97%  Weight: 229 lb (103.9 kg)  Height: 6\' 1"  (1.854 m)    Wt Readings from Last 3 Encounters:  06/08/16 229 lb (103.9 kg)  06/09/15 244 lb (110.7 kg)  12/22/14 247 lb (112 kg)     PHYSICAL EXAM General: NAD HEENT: Normal. Neck: No JVD, no thyromegaly. Lungs: Clear to auscultation bilaterally  with normal respiratory effort. CV: Nondisplaced PMI.  Tachycardic, irregular rhythm, normal S1/S2, no S3, no murmur. No pretibial or periankle edema.     Abdomen: Soft, nontender, no distention.  Neurologic: Alert and oriented.  Psych: Normal affect. Skin: Normal. Musculoskeletal: No gross deformities.    ECG: Most recent ECG reviewed.   Labs: Lab Results  Component Value Date/Time   K 3.5 08/17/2009 09:50 AM   BUN 20 08/17/2009 09:50 AM   CREATININE 1.10 08/17/2009 09:50 AM   HGB 16.5 08/17/2009 09:50 AM     Lipids: No results found for: LDLCALC, LDLDIRECT, CHOL, TRIG, HDL     ASSESSMENT AND PLAN: 1. Rapid atrial flutter/paroxysmal atrial fib: Currently on Toprol-XL, flecainide, and  Eliquis. At the present time he is not symptomatic. Heart rate was in the 90 bpm range with auscultation. For the time being I will not adjust his medications. He is not significantly tachycardic. I will obtain an echocardiogram. I will also make a referral to EP for ablation consideration. The patient is in agreement with this plan.  2. Essential HTN: Controlled. No changes to therapy.  3. Hyperlipidemia: Continue Lipitor.      Disposition: Follow up 6 months with me. FU with EP in near future.  Kate Sable, M.D., F.A.C.C.

## 2016-06-16 DIAGNOSIS — E11319 Type 2 diabetes mellitus with unspecified diabetic retinopathy without macular edema: Secondary | ICD-10-CM | POA: Diagnosis not present

## 2016-06-23 ENCOUNTER — Other Ambulatory Visit: Payer: Self-pay

## 2016-06-23 ENCOUNTER — Ambulatory Visit (INDEPENDENT_AMBULATORY_CARE_PROVIDER_SITE_OTHER): Payer: Medicare Other

## 2016-06-23 DIAGNOSIS — I480 Paroxysmal atrial fibrillation: Secondary | ICD-10-CM | POA: Diagnosis not present

## 2016-06-24 ENCOUNTER — Telehealth: Payer: Self-pay

## 2016-06-24 NOTE — Telephone Encounter (Signed)
-----   Message from Arnoldo Lenis, MD sent at 06/24/2016  2:54 PM EDT ----- Echo looks good, normal heart function   Zandra Abts MD

## 2016-06-24 NOTE — Telephone Encounter (Signed)
Patient notified. Routed to PCP 

## 2016-06-27 ENCOUNTER — Institutional Professional Consult (permissible substitution): Payer: Medicare Other | Admitting: Cardiology

## 2016-06-29 ENCOUNTER — Other Ambulatory Visit: Payer: Medicare Other

## 2016-07-04 DIAGNOSIS — E1165 Type 2 diabetes mellitus with hyperglycemia: Secondary | ICD-10-CM | POA: Diagnosis not present

## 2016-07-04 DIAGNOSIS — I10 Essential (primary) hypertension: Secondary | ICD-10-CM | POA: Diagnosis not present

## 2016-07-04 DIAGNOSIS — E1140 Type 2 diabetes mellitus with diabetic neuropathy, unspecified: Secondary | ICD-10-CM | POA: Diagnosis not present

## 2016-07-04 DIAGNOSIS — E7800 Pure hypercholesterolemia, unspecified: Secondary | ICD-10-CM | POA: Diagnosis not present

## 2016-07-04 DIAGNOSIS — E1129 Type 2 diabetes mellitus with other diabetic kidney complication: Secondary | ICD-10-CM | POA: Diagnosis not present

## 2016-07-04 DIAGNOSIS — E782 Mixed hyperlipidemia: Secondary | ICD-10-CM | POA: Diagnosis not present

## 2016-07-04 DIAGNOSIS — E1143 Type 2 diabetes mellitus with diabetic autonomic (poly)neuropathy: Secondary | ICD-10-CM | POA: Diagnosis not present

## 2016-07-07 DIAGNOSIS — R800 Isolated proteinuria: Secondary | ICD-10-CM | POA: Diagnosis not present

## 2016-07-07 DIAGNOSIS — Z6831 Body mass index (BMI) 31.0-31.9, adult: Secondary | ICD-10-CM | POA: Diagnosis not present

## 2016-07-07 DIAGNOSIS — G629 Polyneuropathy, unspecified: Secondary | ICD-10-CM | POA: Diagnosis not present

## 2016-07-07 DIAGNOSIS — E782 Mixed hyperlipidemia: Secondary | ICD-10-CM | POA: Diagnosis not present

## 2016-07-07 DIAGNOSIS — I482 Chronic atrial fibrillation: Secondary | ICD-10-CM | POA: Diagnosis not present

## 2016-07-07 DIAGNOSIS — E1140 Type 2 diabetes mellitus with diabetic neuropathy, unspecified: Secondary | ICD-10-CM | POA: Diagnosis not present

## 2016-07-07 DIAGNOSIS — E1129 Type 2 diabetes mellitus with other diabetic kidney complication: Secondary | ICD-10-CM | POA: Diagnosis not present

## 2016-07-07 DIAGNOSIS — E1165 Type 2 diabetes mellitus with hyperglycemia: Secondary | ICD-10-CM | POA: Diagnosis not present

## 2016-08-13 ENCOUNTER — Other Ambulatory Visit: Payer: Self-pay | Admitting: Cardiovascular Disease

## 2016-11-04 DIAGNOSIS — E782 Mixed hyperlipidemia: Secondary | ICD-10-CM | POA: Diagnosis not present

## 2016-11-04 DIAGNOSIS — N189 Chronic kidney disease, unspecified: Secondary | ICD-10-CM | POA: Diagnosis not present

## 2016-11-04 DIAGNOSIS — E1143 Type 2 diabetes mellitus with diabetic autonomic (poly)neuropathy: Secondary | ICD-10-CM | POA: Diagnosis not present

## 2016-11-04 DIAGNOSIS — E1165 Type 2 diabetes mellitus with hyperglycemia: Secondary | ICD-10-CM | POA: Diagnosis not present

## 2016-11-04 DIAGNOSIS — I10 Essential (primary) hypertension: Secondary | ICD-10-CM | POA: Diagnosis not present

## 2016-11-04 DIAGNOSIS — E7800 Pure hypercholesterolemia, unspecified: Secondary | ICD-10-CM | POA: Diagnosis not present

## 2016-11-10 DIAGNOSIS — Z23 Encounter for immunization: Secondary | ICD-10-CM | POA: Diagnosis not present

## 2016-11-10 DIAGNOSIS — E782 Mixed hyperlipidemia: Secondary | ICD-10-CM | POA: Diagnosis not present

## 2016-11-10 DIAGNOSIS — E1165 Type 2 diabetes mellitus with hyperglycemia: Secondary | ICD-10-CM | POA: Diagnosis not present

## 2016-11-10 DIAGNOSIS — I482 Chronic atrial fibrillation: Secondary | ICD-10-CM | POA: Diagnosis not present

## 2016-11-10 DIAGNOSIS — E1140 Type 2 diabetes mellitus with diabetic neuropathy, unspecified: Secondary | ICD-10-CM | POA: Diagnosis not present

## 2016-11-10 DIAGNOSIS — G629 Polyneuropathy, unspecified: Secondary | ICD-10-CM | POA: Diagnosis not present

## 2016-11-10 DIAGNOSIS — Z6831 Body mass index (BMI) 31.0-31.9, adult: Secondary | ICD-10-CM | POA: Diagnosis not present

## 2016-11-10 DIAGNOSIS — E1129 Type 2 diabetes mellitus with other diabetic kidney complication: Secondary | ICD-10-CM | POA: Diagnosis not present

## 2016-11-11 DIAGNOSIS — H18832 Recurrent erosion of cornea, left eye: Secondary | ICD-10-CM | POA: Diagnosis not present

## 2016-11-17 DIAGNOSIS — H18832 Recurrent erosion of cornea, left eye: Secondary | ICD-10-CM | POA: Diagnosis not present

## 2016-11-17 DIAGNOSIS — S0502XD Injury of conjunctiva and corneal abrasion without foreign body, left eye, subsequent encounter: Secondary | ICD-10-CM | POA: Diagnosis not present

## 2016-11-17 DIAGNOSIS — H04123 Dry eye syndrome of bilateral lacrimal glands: Secondary | ICD-10-CM | POA: Diagnosis not present

## 2016-12-15 ENCOUNTER — Encounter: Payer: Self-pay | Admitting: Cardiovascular Disease

## 2016-12-15 ENCOUNTER — Ambulatory Visit (INDEPENDENT_AMBULATORY_CARE_PROVIDER_SITE_OTHER): Payer: Medicare Other | Admitting: Cardiovascular Disease

## 2016-12-15 VITALS — BP 130/72 | HR 55 | Ht 73.0 in | Wt 232.0 lb

## 2016-12-15 DIAGNOSIS — I4892 Unspecified atrial flutter: Secondary | ICD-10-CM | POA: Diagnosis not present

## 2016-12-15 DIAGNOSIS — Z7901 Long term (current) use of anticoagulants: Secondary | ICD-10-CM

## 2016-12-15 DIAGNOSIS — I4891 Unspecified atrial fibrillation: Secondary | ICD-10-CM | POA: Diagnosis not present

## 2016-12-15 DIAGNOSIS — I10 Essential (primary) hypertension: Secondary | ICD-10-CM | POA: Diagnosis not present

## 2016-12-15 DIAGNOSIS — E785 Hyperlipidemia, unspecified: Secondary | ICD-10-CM

## 2016-12-15 NOTE — Patient Instructions (Addendum)
Medication Instructions:  Continue all current medications.  Labwork: none  Testing/Procedures: none  Follow-Up: To be determined.    Any Other Special Instructions Will Be Listed Below (If Applicable). You have been referred to:  Dr. Thompson Grayer   If you need a refill on your cardiac medications before your next appointment, please call your pharmacy.

## 2016-12-15 NOTE — Progress Notes (Signed)
SUBJECTIVE: The patient presents for follow-up of paroxysmal atrial fibrillation and flutter and also has chronic hypertension.  He is anticoagulated with apixaban.  Echocardiogram 06/23/16 demonstrated normal left ventricular systolic and diastolic function and normal regional wall motion, LVEF 60-65%.  Left atrial size was normal.  There was mild mitral regurgitation.  I made a referral to EP at his last visit but it does not appear he was ever evaluated by them.  He has numerous questions about medications and arrhythmias.  He has episodic palpitations with heart rates getting as high as 115 bpm but no higher.  Blood pressures tend to range from 90/60s when he goes back into a normal rhythm, heart rate is in the high 50 bpm range with blood pressures in the 130s/60s.  He denies chest pain and leg swelling.  He denies syncope.  He canceled his appointment with EP because he said something was wrong with his transmission.    Review of Systems: As per "subjective", otherwise negative.  No Known Allergies  Current Outpatient Medications  Medication Sig Dispense Refill  . amLODipine (NORVASC) 10 MG tablet Take 1 tablet (10 mg total) by mouth daily. 90 tablet 3  . apixaban (ELIQUIS) 5 MG TABS tablet Take 1 tablet (5 mg total) by mouth 2 (two) times daily. 60 tablet 6  . atorvastatin (LIPITOR) 10 MG tablet Take by mouth. 0.5 Tablets 3 days a week    . cyclobenzaprine (FLEXERIL) 10 MG tablet Take 10 mg by mouth as needed for muscle spasms.    . flecainide (TAMBOCOR) 150 MG tablet TAKE 1 TABLET TWICE A DAY 180 tablet 1  . gabapentin (NEURONTIN) 100 MG capsule Take 200 mg by mouth at bedtime.    Marland Kitchen lisinopril-hydrochlorothiazide (PRINZIDE,ZESTORETIC) 20-12.5 MG per tablet Take 2 tablets by mouth daily.     . Melatonin-Pyridoxine (MELATIN PO) Take 3 mg by mouth.    . metFORMIN (GLUCOPHAGE-XR) 500 MG 24 hr tablet Take 500 mg by mouth daily with breakfast.   1  . metoprolol (TOPROL-XL)  200 MG 24 hr tablet Take 200 mg by mouth daily.    . ranitidine (ZANTAC) 150 MG tablet Take 150 mg by mouth 2 (two) times daily.     No current facility-administered medications for this visit.     Past Medical History:  Diagnosis Date  . Atrial fibrillation ([place])   . Diabetes mellitus ([place])   . Dyslipidemia   . Ejection fraction    EF 55-60%, echo, May, 2014  . Heart murmur    As a Child  . Hypertension   . Overweight(278.02)     Past Surgical History:  Procedure Laterality Date  . BACK SURGERY  2011/08    Social History   Socioeconomic History  . Marital status: Married    Spouse name: Not on file  . Number of children: Not on file  . Years of education: Not on file  . Highest education level: Not on file  Social Needs  . Financial resource strain: Not on file  . Food insecurity - worry: Not on file  . Food insecurity - inability: Not on file  . Transportation needs - medical: Not on file  . Transportation needs - non-medical: Not on file  Occupational History  . Not on file  Tobacco Use  . Smoking status: Never Smoker  . Smokeless tobacco: Never Used  Substance and Sexual Activity  . Alcohol use: Not on file  . Drug use: Not on file  .  Sexual activity: Not on file  Other Topics Concern  . Not on file  Social History Narrative  . Not on file     Vitals:   12/15/16 1402  BP: 130/72  Pulse: (!) 55  SpO2: 98%  Weight: 232 lb (105.2 kg)  Height: 6\' 1"  (1.854 m)    Wt Readings from Last 3 Encounters:  12/15/16 232 lb (105.2 kg)  06/08/16 229 lb (103.9 kg)  06/09/15 244 lb (110.7 kg)     PHYSICAL EXAM General: NAD HEENT: Normal. Neck: No JVD, no thyromegaly. Lungs: Clear to auscultation bilaterally with normal respiratory effort. CV: Regular rate and rhythm, normal S1/S2, no S3/S4, no murmur. No pretibial or periankle edema.  Abdomen: Soft, nontender, no distention.  Neurologic: Alert and oriented.  Psych: Normal affect. Skin:  Normal. Musculoskeletal: No gross deformities.    ECG: Most recent ECG reviewed.   Labs: Lab Results  Component Value Date/Time   K 3.5 08/17/2009 09:50 AM   BUN 20 08/17/2009 09:50 AM   CREATININE 1.10 08/17/2009 09:50 AM   HGB 16.5 08/17/2009 09:50 AM     Lipids: No results found for: LDLCALC, LDLDIRECT, CHOL, TRIG, HDL     ASSESSMENT AND PLAN: 1.  Paroxysmal atrial fibrillation and flutter: Palpitations occur fairly frequently.  I previously made a referral to EP for ablation consideration but this was not followed through.  I have asked him to see EP for ablation consideration.  He is on Eliquis for anticoagulation and is taking flecainide and Toprol-XL.  I will not make any medication changes today.  2.  Chronic hypertension: Controlled.  No changes to therapy.  3.  Hyperlipidemia: Continue Lipitor.    Disposition: Follow up with EP in the near future.  Follow-up with me to be determined.  Time spent: 40 minutes, of which greater than 50% was spent reviewing symptoms, relevant blood tests and studies, and discussing management plan with the patient.    Kate Sable, M.D., F.A.C.C.

## 2017-01-12 ENCOUNTER — Ambulatory Visit: Payer: Medicare Other | Admitting: Cardiovascular Disease

## 2017-01-20 ENCOUNTER — Ambulatory Visit (INDEPENDENT_AMBULATORY_CARE_PROVIDER_SITE_OTHER): Payer: Medicare Other | Admitting: Internal Medicine

## 2017-01-20 ENCOUNTER — Encounter: Payer: Self-pay | Admitting: Internal Medicine

## 2017-01-20 VITALS — BP 136/88 | HR 60 | Ht 73.0 in | Wt 227.2 lb

## 2017-01-20 DIAGNOSIS — I10 Essential (primary) hypertension: Secondary | ICD-10-CM | POA: Diagnosis not present

## 2017-01-20 DIAGNOSIS — I4892 Unspecified atrial flutter: Secondary | ICD-10-CM | POA: Diagnosis not present

## 2017-01-20 DIAGNOSIS — I480 Paroxysmal atrial fibrillation: Secondary | ICD-10-CM | POA: Diagnosis not present

## 2017-01-20 NOTE — Patient Instructions (Addendum)
Medication Instructions:  Your physician recommends that you continue on your current medications as directed. Please refer to the Current Medication list given to you today.   Labwork: None ordered   Testing/Procedures: Your physician has recommended that you have an ablation. Catheter ablation is a medical procedure used to treat some cardiac arrhythmias (irregular heartbeats). During catheter ablation, a long, thin, flexible tube is put into a blood vessel in your groin (upper thigh), or neck. This tube is called an ablation catheter. It is then guided to your heart through the blood vessel. Radio frequency waves destroy small areas of heart tissue where abnormal heartbeats may cause an arrhythmia to start. Please see the instruction sheet given to you today.  Call if you decide to proceed with ablation. [phone]  Follow-Up: Your physician recommends that you schedule a follow-up appointment as needed   Any Other Special Instructions Will Be Listed Below (If Applicable).     If you need a refill on your cardiac medications before your next appointment, please call your pharmacy.

## 2017-01-20 NOTE — Progress Notes (Signed)
Electrophysiology Office Note   Date:  01/23/2017   ID:  Nathan Sutton, DOB 1950/08/28, MRN [id number]  PCP:  Manon Hilding, MD  Cardiologist:  Dr Bronson Ing Primary Electrophysiologist: Thompson Grayer, MD    Chief Complaint  Patient presents with  . Atrial Fibrillation    consult      History of Present Illness: Nathan Sutton is a 67 y.o. male who presents today for electrophysiology evaluation.   He is referred by Dr Bronson Ing for EP consultation regarding atrial fibrillation.  The patient has had afib for several years.  He has been found to have both afib and also atrial flutter.  He has been treated with metoprolol and flecainide but continues to have afib. He is unaware of triggers/ precipitants.  He reports palpitations and fatigue with his afib.   Today, he denies symptoms of palpitations, chest pain, shortness of breath, orthopnea, PND, lower extremity edema, claudication, dizziness, presyncope, syncope, bleeding, or neurologic sequela. The patient is tolerating medications without difficulties and is otherwise without complaint today.    Past Medical History:  Diagnosis Date  . Atrial fibrillation ([place])   . Diabetes mellitus ([place])   . Dyslipidemia   . Ejection fraction    EF 55-60%, echo, May, 2014  . Heart murmur    As a Child  . Hypertension   . Overweight(278.02)    Past Surgical History:  Procedure Laterality Date  . BACK SURGERY  2011/08     Current Outpatient Medications  Medication Sig Dispense Refill  . amLODipine (NORVASC) 10 MG tablet Take 1 tablet (10 mg total) by mouth daily. 90 tablet 3  . apixaban (ELIQUIS) 5 MG TABS tablet Take 1 tablet (5 mg total) by mouth 2 (two) times daily. 60 tablet 6  . atorvastatin (LIPITOR) 10 MG tablet Take by mouth. 0.5 Tablets 3 days a week    . cetirizine (ZYRTEC) 10 MG tablet Take 10 mg by mouth daily as needed for allergies.    . cyclobenzaprine (FLEXERIL) 10 MG tablet Take 10 mg by mouth as needed for muscle  spasms.    . flecainide (TAMBOCOR) 150 MG tablet TAKE 1 TABLET TWICE A DAY 180 tablet 1  . gabapentin (NEURONTIN) 100 MG capsule Take 200 mg by mouth at bedtime.    Marland Kitchen lisinopril-hydrochlorothiazide (PRINZIDE,ZESTORETIC) 20-12.5 MG per tablet Take 2 tablets by mouth daily.     . Melatonin-Pyridoxine (MELATIN PO) Take 3 mg by mouth.    . metFORMIN (GLUCOPHAGE-XR) 500 MG 24 hr tablet Take 500 mg by mouth daily with breakfast.   1  . metoprolol (TOPROL-XL) 200 MG 24 hr tablet Take 200 mg by mouth daily.    . ranitidine (ZANTAC) 150 MG tablet Take 150 mg by mouth 2 (two) times daily.     No current facility-administered medications for this visit.     Allergies:   Patient has no known allergies.   Social History:  The patient  reports that  has never smoked. he has never used smokeless tobacco.   Family History:  The patient's  family history includes Heart failure in his mother; Stroke in his father.    ROS:  Please see the history of present illness.   All other systems are personally reviewed and negative.    PHYSICAL EXAM: VS:  BP 136/88   Pulse 60   Ht 6\' 1"  (1.854 m)   Wt 227 lb 4 oz (103.1 kg)   SpO2 99%   BMI 29.98 kg/m  ,  BMI Body mass index is 29.98 kg/m. GEN: Well nourished, well developed, in no acute distress  HEENT: normal  Neck: no JVD, carotid bruits, or masses Cardiac: RRR; no murmurs, rubs, or gallops,no edema  Respiratory:  clear to auscultation bilaterally, normal work of breathing GI: soft, nontender, nondistended, + BS MS: no deformity or atrophy  Skin: warm and dry  Neuro:  Strength and sensation are intact Psych: euthymic mood, full affect  EKG:  EKG is ordered today. The ekg ordered today is personally reviewed and shows sinus rhythm 60 bpm, PR 222 msec, Qtc 420 msec   Recent Labs: No results found for requested labs within last 8760 hours.  personally reviewed   Lipid Panel  No results found for: CHOL, TRIG, HDL, CHOLHDL, VLDL, LDLCALC,  LDLDIRECT personally reviewed   Wt Readings from Last 3 Encounters:  01/20/17 227 lb 4 oz (103.1 kg)  12/15/16 232 lb (105.2 kg)  06/08/16 229 lb (103.9 kg)      Other studies personally reviewed: Additional studies/ records that were reviewed today include: Dr Court Joy notes, prior echo  Review of the above records today demonstrates: as above   ASSESSMENT AND PLAN:  1.  Paroxysmal atrial fibrillation and typical appearing atrial flutter The patient has symptomatic atrial arrhythmias.  He has failed medical therapy with flecainide.  Therapeutic strategies for afib and atrial flutter including medicine and ablation were discussed in detail with the patient today. Risk, benefits, and alternatives to EP study and radiofrequency ablation for afib were also discussed in detail today. These risks include but are not limited to stroke, bleeding, vascular damage, tamponade, perforation, damage to the esophagus, lungs, and other structures, pulmonary vein stenosis, worsening renal function, and death. The patient understands these risk and wishes to think about this further.  He will contact my office if he decides to proceed.  I would advise cardiac CT within 1 week of ablation should he decide to proceed. chads2vasc score is 3.  Continue eliquis  2. HTN Stable No change required today  3. Overyweight Body mass index is 29.98 kg/m. Lifestyle modification encouraged  Follow-up:  With Dr Bronson Ing as scheduled.  He will contact my office should he decide to proceed with ablation.  Otherwise, I will see him as needed going forward.  Current medicines are reviewed at length with the patient today.   The patient does not have concerns regarding his medicines.  The following changes were made today:  none   Signed, Thompson Grayer, MD   [place] [address] [phone] (office) [phone] (fax)

## 2017-02-01 ENCOUNTER — Telehealth: Payer: Self-pay | Admitting: Internal Medicine

## 2017-02-01 DIAGNOSIS — I480 Paroxysmal atrial fibrillation: Secondary | ICD-10-CM

## 2017-02-01 NOTE — Telephone Encounter (Addendum)
Labs 02/16/17--in [place]---l;ab orders entered Cardiac CT week 02/20/17 Ablation 03/02/17---5:30/7:30

## 2017-02-01 NOTE — Telephone Encounter (Signed)
Patient calling,   States that he would like to proceed with the ablation.

## 2017-02-06 DIAGNOSIS — H16142 Punctate keratitis, left eye: Secondary | ICD-10-CM | POA: Diagnosis not present

## 2017-02-09 ENCOUNTER — Other Ambulatory Visit: Payer: Self-pay | Admitting: Cardiovascular Disease

## 2017-02-09 DIAGNOSIS — H04123 Dry eye syndrome of bilateral lacrimal glands: Secondary | ICD-10-CM | POA: Diagnosis not present

## 2017-02-09 DIAGNOSIS — H18832 Recurrent erosion of cornea, left eye: Secondary | ICD-10-CM | POA: Diagnosis not present

## 2017-02-09 NOTE — Addendum Note (Signed)
Addended by: Janan Halter F on: 02/09/2017 04:12 PM   Modules accepted: Orders

## 2017-02-16 ENCOUNTER — Telehealth: Payer: Self-pay | Admitting: *Deleted

## 2017-02-16 ENCOUNTER — Other Ambulatory Visit: Payer: Medicare Other | Admitting: *Deleted

## 2017-02-16 DIAGNOSIS — I480 Paroxysmal atrial fibrillation: Secondary | ICD-10-CM

## 2017-02-16 NOTE — Telephone Encounter (Signed)
CORRECT LAB ORDER FOR BMET AND CBC W DIFF, ORDERED BY DR ALLRED, ENTERED.

## 2017-02-17 ENCOUNTER — Telehealth: Payer: Self-pay | Admitting: Internal Medicine

## 2017-02-17 LAB — CBC WITH DIFFERENTIAL/PLATELET
BASOS: 1 %
Basophils Absolute: 0.1 10*3/uL (ref 0.0–0.2)
EOS (ABSOLUTE): 0.1 10*3/uL (ref 0.0–0.4)
EOS: 2 %
Hematocrit: 43.7 % (ref 37.5–51.0)
Hemoglobin: 15.1 g/dL (ref 13.0–17.7)
IMMATURE GRANS (ABS): 0 10*3/uL (ref 0.0–0.1)
IMMATURE GRANULOCYTES: 0 %
Lymphocytes Absolute: 1.6 10*3/uL (ref 0.7–3.1)
Lymphs: 23 %
MCH: 31.1 pg (ref 26.6–33.0)
MCHC: 34.6 g/dL (ref 31.5–35.7)
MCV: 90 fL (ref 79–97)
MONOS ABS: 0.9 10*3/uL (ref 0.1–0.9)
Monocytes: 12 %
NEUTROS ABS: 4.4 10*3/uL (ref 1.4–7.0)
NEUTROS PCT: 62 %
PLATELETS: 283 10*3/uL (ref 150–379)
RBC: 4.85 x10E6/uL (ref 4.14–5.80)
RDW: 13.4 % (ref 12.3–15.4)
WBC: 7.1 10*3/uL (ref 3.4–10.8)

## 2017-02-17 LAB — BASIC METABOLIC PANEL
BUN/Creatinine Ratio: 12 (ref 10–24)
BUN: 12 mg/dL (ref 8–27)
CO2: 27 mmol/L (ref 20–29)
CREATININE: 1 mg/dL (ref 0.76–1.27)
Calcium: 9.8 mg/dL (ref 8.6–10.2)
Chloride: 100 mmol/L (ref 96–106)
GFR, EST AFRICAN AMERICAN: 91 mL/min/{1.73_m2} (ref >59)
GFR, EST NON AFRICAN AMERICAN: 79 mL/min/{1.73_m2} (ref >59)
Glucose: 138 mg/dL — ABNORMAL HIGH (ref 65–99)
Potassium: 3.9 mmol/L (ref 3.5–5.2)
SODIUM: 142 mmol/L (ref 134–144)

## 2017-02-17 NOTE — Telephone Encounter (Signed)
Please arrive at the [facility] main entrance of [facility] on 02/21/17 at 10:00AM (30-45 minutes prior to test start time)  [facility] [address] [place], [place] [address] [phone]  Proceed to the [facility] Radiology Department (First Floor).  Please follow these instructions carefully (unless otherwise directed):  Hold all erectile dysfunction medications at least 48 hours prior to test.  On the Night Before the Test: . Drink plenty of water. . Do not consume any caffeinated/decaffeinated beverages or chocolate 12 hours prior to your test. . Do not take any antihistamines 12 hours prior to your test. . If you take Metformin do not take 24 hours prior to test.   On the Day of the Test: . Drink plenty of water. Do not drink any water within one hour of the test. . Do not eat any food 4 hours prior to the test. . You may take your regular medications prior to the test. .   After the Test: . Drink plenty of water. . After receiving IV contrast, you may experience a mild flushed feeling. This is normal. . On occasion, you may experience a mild rash up to 24 hours after the test. This is not dangerous. If this occurs, you can take Benadryl 25 mg and increase your fluid intake. . If you experience trouble breathing, this can be serious. If it is severe call 911 IMMEDIATELY. If it is mild, please call our office. . If you take any of these medications: Glipizide/Metformin, Avandament, Glucavance, please do not take 48 hours after completing test.   For Ablation: Please arrive at [facility] on 03/02/17 at 5:30am Do not eat or drink after midnight the night prior to the procedure Do not take any medications the morning of the test Plan for one night stay Will need someone to drive you home at discharge

## 2017-02-17 NOTE — Telephone Encounter (Signed)
Patient aware of all procedure instructions

## 2017-02-17 NOTE — Telephone Encounter (Signed)
error 

## 2017-02-21 ENCOUNTER — Encounter (HOSPITAL_COMMUNITY): Payer: Self-pay

## 2017-02-21 ENCOUNTER — Ambulatory Visit (HOSPITAL_COMMUNITY)
Admission: RE | Admit: 2017-02-21 | Discharge: 2017-02-21 | Disposition: A | Discharge status: Home / Self Care | Payer: Medicare Other | Source: Ambulatory Visit | Attending: Internal Medicine | Admitting: Internal Medicine

## 2017-02-21 DIAGNOSIS — I480 Paroxysmal atrial fibrillation: Secondary | ICD-10-CM | POA: Diagnosis not present

## 2017-02-21 DIAGNOSIS — I4891 Unspecified atrial fibrillation: Secondary | ICD-10-CM | POA: Diagnosis not present

## 2017-02-21 DIAGNOSIS — R918 Other nonspecific abnormal finding of lung field: Secondary | ICD-10-CM | POA: Diagnosis not present

## 2017-02-21 MED ORDER — IOPAMIDOL (ISOVUE-370) INJECTION 76%
INTRAVENOUS | Status: AC
  Administered 2017-02-21: 80 mL
  Filled 2017-02-21: qty 100

## 2017-02-21 MED ORDER — REGADENOSON 0.4 MG/5ML IV SOLN
0.4000 mg | Freq: Once | INTRAVENOUS | Status: DC
  Filled 2017-02-21: qty 5

## 2017-02-24 ENCOUNTER — Telehealth: Payer: Self-pay | Admitting: Internal Medicine

## 2017-02-24 NOTE — Telephone Encounter (Signed)
New message  Pt verbalized that he is calling for the RN  He said that he wants to speak to [facility], there is an error showing up for a procedure  That he had done and it shows that he was a [place]. Pt said this is not the first time this has happened.

## 2017-02-24 NOTE — Telephone Encounter (Signed)
Left a message for the pt to call back.  

## 2017-02-28 NOTE — Telephone Encounter (Signed)
Spoke with patient in regards to questions pertaining to his ablation 2/28.. We discussed medication and procedure particulars.. Patient was satisfied and verbalized understanding.Nathan Sutton

## 2017-02-28 NOTE — Telephone Encounter (Signed)
Pt returned this office call he has a question about Ablation 03/02/17 Thursday.  His Medication for today and some about the procedure?

## 2017-03-01 NOTE — Anesthesia Preprocedure Evaluation (Addendum)
Anesthesia Evaluation  Patient identified by MRN, date of birth, ID band Patient awake    Reviewed: Allergy & Precautions, NPO status , Patient's Chart, lab work & pertinent test results  History of Anesthesia Complications Negative for: history of anesthetic complications  Airway Mallampati: III  TM Distance: >3 FB Neck ROM: Full    Dental no notable dental hx. (+) Dental Advisory Given   Pulmonary neg pulmonary ROS,    Pulmonary exam normal        Cardiovascular hypertension, Pt. on medications and Pt. on home beta blockers Normal cardiovascular exam  Study Conclusions  - Left ventricle: The cavity size was normal. Wall thickness was   increased in a pattern of mild LVH. Systolic function was normal.   The estimated ejection fraction was in the range of 60% to 65%.   Left ventricular diastolic function parameters were normal. - Aortic valve: AV is thickened with minimally restricted motion. - Mitral valve: There was mild regurgitation.    Neuro/Psych negative neurological ROS     GI/Hepatic negative GI ROS, Neg liver ROS,   Endo/Other  diabetes  Renal/GU negative Renal ROS     Musculoskeletal negative musculoskeletal ROS (+)   Abdominal   Peds  Hematology negative hematology ROS (+)   Anesthesia Other Findings Day of surgery medications reviewed with the patient.  Reproductive/Obstetrics                            Anesthesia Physical Anesthesia Plan  ASA: III  Anesthesia Plan: General   Post-op Pain Management:    Induction: Intravenous  PONV Risk Score and Plan: 2 and Ondansetron and Dexamethasone  Airway Management Planned: Oral ETT  Additional Equipment: Arterial line  Intra-op Plan:   Post-operative Plan: Extubation in OR  Informed Consent: I have reviewed the patients History and Physical, chart, labs and discussed the procedure including the risks, benefits  and alternatives for the proposed anesthesia with the patient or authorized representative who has indicated his/her understanding and acceptance.   Dental advisory given  Plan Discussed with: CRNA and Anesthesiologist  Anesthesia Plan Comments:        Anesthesia Quick Evaluation

## 2017-03-02 ENCOUNTER — Encounter (HOSPITAL_COMMUNITY)
Admission: RE | Disposition: A | Discharge status: Home / Self Care | Payer: Self-pay | Source: Ambulatory Visit | Attending: Internal Medicine

## 2017-03-02 ENCOUNTER — Encounter (HOSPITAL_COMMUNITY): Payer: Self-pay | Admitting: Certified Registered Nurse Anesthetist

## 2017-03-02 ENCOUNTER — Ambulatory Visit (HOSPITAL_COMMUNITY): Payer: Medicare Other | Admitting: Anesthesiology

## 2017-03-02 ENCOUNTER — Ambulatory Visit (HOSPITAL_COMMUNITY)
Admission: RE | Admit: 2017-03-02 | Discharge: 2017-03-02 | Disposition: A | Discharge status: Home / Self Care | Payer: Medicare Other | Source: Ambulatory Visit | Attending: Internal Medicine | Admitting: Internal Medicine

## 2017-03-02 ENCOUNTER — Other Ambulatory Visit: Payer: Self-pay

## 2017-03-02 DIAGNOSIS — E119 Type 2 diabetes mellitus without complications: Secondary | ICD-10-CM | POA: Insufficient documentation

## 2017-03-02 DIAGNOSIS — I4891 Unspecified atrial fibrillation: Secondary | ICD-10-CM | POA: Diagnosis not present

## 2017-03-02 DIAGNOSIS — Z7984 Long term (current) use of oral hypoglycemic drugs: Secondary | ICD-10-CM | POA: Diagnosis not present

## 2017-03-02 DIAGNOSIS — Z79899 Other long term (current) drug therapy: Secondary | ICD-10-CM | POA: Diagnosis not present

## 2017-03-02 DIAGNOSIS — I340 Nonrheumatic mitral (valve) insufficiency: Secondary | ICD-10-CM | POA: Diagnosis not present

## 2017-03-02 DIAGNOSIS — I10 Essential (primary) hypertension: Secondary | ICD-10-CM | POA: Diagnosis not present

## 2017-03-02 DIAGNOSIS — E785 Hyperlipidemia, unspecified: Secondary | ICD-10-CM | POA: Diagnosis not present

## 2017-03-02 DIAGNOSIS — I481 Persistent atrial fibrillation: Secondary | ICD-10-CM | POA: Diagnosis not present

## 2017-03-02 DIAGNOSIS — Z7901 Long term (current) use of anticoagulants: Secondary | ICD-10-CM | POA: Insufficient documentation

## 2017-03-02 DIAGNOSIS — I483 Typical atrial flutter: Secondary | ICD-10-CM | POA: Diagnosis not present

## 2017-03-02 DIAGNOSIS — I4819 Other persistent atrial fibrillation: Secondary | ICD-10-CM | POA: Diagnosis present

## 2017-03-02 DIAGNOSIS — I4892 Unspecified atrial flutter: Secondary | ICD-10-CM | POA: Diagnosis not present

## 2017-03-02 HISTORY — DX: Type 2 diabetes mellitus without complications: E11.9

## 2017-03-02 HISTORY — PX: ATRIAL FIBRILLATION ABLATION: EP1191

## 2017-03-02 LAB — POCT ACTIVATED CLOTTING TIME
ACTIVATED CLOTTING TIME: 290 s
Activated Clotting Time: 285 seconds
Activated Clotting Time: 175 seconds

## 2017-03-02 LAB — GLUCOSE, CAPILLARY
Glucose-Capillary: 114 mg/dL — ABNORMAL HIGH (ref 65–99)
Glucose-Capillary: 169 mg/dL — ABNORMAL HIGH (ref 65–99)

## 2017-03-02 SURGERY — ATRIAL FIBRILLATION ABLATION
Anesthesia: General

## 2017-03-02 MED ORDER — HEPARIN SODIUM (PORCINE) 1000 UNIT/ML IJ SOLN
INTRAMUSCULAR | Status: DC | PRN
  Administered 2017-03-02: 4000 [IU] via INTRAVENOUS
  Administered 2017-03-02: 12000 [IU] via INTRAVENOUS
  Administered 2017-03-02: 3000 [IU] via INTRAVENOUS

## 2017-03-02 MED ORDER — LISINOPRIL 20 MG PO TABS
20.0000 mg | ORAL_TABLET | Freq: Every day | ORAL | Status: DC
  Filled 2017-03-02: qty 1

## 2017-03-02 MED ORDER — PHENYLEPHRINE HCL 10 MG/ML IJ SOLN
INTRAMUSCULAR | Status: DC | PRN
  Administered 2017-03-02: 120 ug via INTRAVENOUS
  Administered 2017-03-02: 80 ug via INTRAVENOUS
  Administered 2017-03-02: 120 ug via INTRAVENOUS
  Administered 2017-03-02: 80 ug via INTRAVENOUS

## 2017-03-02 MED ORDER — HYDROCHLOROTHIAZIDE 12.5 MG PO CAPS
12.5000 mg | ORAL_CAPSULE | Freq: Every day | ORAL | Status: DC
  Filled 2017-03-02: qty 1

## 2017-03-02 MED ORDER — ROCURONIUM BROMIDE 100 MG/10ML IV SOLN
INTRAVENOUS | Status: DC | PRN
  Administered 2017-03-02: 25 mg via INTRAVENOUS
  Administered 2017-03-02: 75 mg via INTRAVENOUS

## 2017-03-02 MED ORDER — HEPARIN SODIUM (PORCINE) 1000 UNIT/ML IJ SOLN
INTRAMUSCULAR | Status: DC | PRN
  Administered 2017-03-02: 1000 [IU] via INTRAVENOUS

## 2017-03-02 MED ORDER — HEPARIN SODIUM (PORCINE) 1000 UNIT/ML IJ SOLN
INTRAMUSCULAR | Status: AC
  Filled 2017-03-02: qty 1

## 2017-03-02 MED ORDER — IOPAMIDOL (ISOVUE-370) INJECTION 76%
INTRAVENOUS | Status: DC | PRN
  Administered 2017-03-02: 3 mL via INTRAVENOUS

## 2017-03-02 MED ORDER — FENTANYL CITRATE (PF) 100 MCG/2ML IJ SOLN
INTRAMUSCULAR | Status: DC | PRN
  Administered 2017-03-02: 100 ug via INTRAVENOUS

## 2017-03-02 MED ORDER — SUGAMMADEX SODIUM 200 MG/2ML IV SOLN
INTRAVENOUS | Status: DC | PRN
  Administered 2017-03-02: 300 mg via INTRAVENOUS

## 2017-03-02 MED ORDER — HEPARIN (PORCINE) IN NACL 2-0.9 UNIT/ML-% IJ SOLN
INTRAMUSCULAR | Status: AC | PRN
  Administered 2017-03-02: 500 mL

## 2017-03-02 MED ORDER — METOPROLOL SUCCINATE ER 100 MG PO TB24
100.0000 mg | ORAL_TABLET | Freq: Every day | ORAL | Status: DC
  Administered 2017-03-02: 100 mg via ORAL
  Filled 2017-03-02: qty 1

## 2017-03-02 MED ORDER — BUPIVACAINE HCL (PF) 0.25 % IJ SOLN
INTRAMUSCULAR | Status: DC | PRN
  Administered 2017-03-02: 30 mL

## 2017-03-02 MED ORDER — DEXAMETHASONE SODIUM PHOSPHATE 10 MG/ML IJ SOLN
INTRAMUSCULAR | Status: DC | PRN
  Administered 2017-03-02: 4 mg via INTRAVENOUS

## 2017-03-02 MED ORDER — GABAPENTIN 300 MG PO CAPS: 300.0000 mg | ORAL_CAPSULE | Freq: Every day | ORAL | Status: DC

## 2017-03-02 MED ORDER — LISINOPRIL-HYDROCHLOROTHIAZIDE 20-12.5 MG PO TABS: 2.0000 | ORAL_TABLET | Freq: Every day | ORAL | Status: DC

## 2017-03-02 MED ORDER — PROPOFOL 10 MG/ML IV BOLUS
INTRAVENOUS | Status: DC | PRN
  Administered 2017-03-02: 160 mg via INTRAVENOUS
  Administered 2017-03-02: 40 mg via INTRAVENOUS

## 2017-03-02 MED ORDER — ONDANSETRON HCL 4 MG/2ML IJ SOLN
INTRAMUSCULAR | Status: DC | PRN
  Administered 2017-03-02: 4 mg via INTRAVENOUS

## 2017-03-02 MED ORDER — EPHEDRINE SULFATE 50 MG/ML IJ SOLN
INTRAMUSCULAR | Status: DC | PRN
  Administered 2017-03-02: 5 mg via INTRAVENOUS

## 2017-03-02 MED ORDER — SODIUM CHLORIDE 0.9 % IV SOLN
INTRAVENOUS | Status: DC
  Administered 2017-03-02 (×2): via INTRAVENOUS

## 2017-03-02 MED ORDER — PANTOPRAZOLE SODIUM 40 MG PO TBEC: 40.0000 mg | DELAYED_RELEASE_TABLET | Freq: Every day | ORAL | 1 refills | Status: DC

## 2017-03-02 MED ORDER — AMLODIPINE BESYLATE 10 MG PO TABS
10.0000 mg | ORAL_TABLET | Freq: Every day | ORAL | Status: DC
  Administered 2017-03-02: 10 mg via ORAL
  Filled 2017-03-02: qty 1

## 2017-03-02 MED ORDER — ACETAMINOPHEN 325 MG PO TABS: 650.0000 mg | ORAL_TABLET | ORAL | Status: DC | PRN

## 2017-03-02 MED ORDER — SODIUM CHLORIDE 0.9% FLUSH: 3.0000 mL | INTRAVENOUS | Status: DC | PRN

## 2017-03-02 MED ORDER — PROTAMINE SULFATE 10 MG/ML IV SOLN
INTRAVENOUS | Status: DC | PRN
  Administered 2017-03-02: 30 mg via INTRAVENOUS

## 2017-03-02 MED ORDER — MIDAZOLAM HCL 5 MG/5ML IJ SOLN
INTRAMUSCULAR | Status: DC | PRN
  Administered 2017-03-02: 2 mg via INTRAVENOUS

## 2017-03-02 MED ORDER — METOPROLOL SUCCINATE ER 100 MG PO TB24: 100.0000 mg | ORAL_TABLET | Freq: Every day | ORAL | 1 refills | Status: DC

## 2017-03-02 MED ORDER — HYDROCODONE-ACETAMINOPHEN 5-325 MG PO TABS: 1.0000 | ORAL_TABLET | ORAL | Status: DC | PRN

## 2017-03-02 MED ORDER — ONDANSETRON HCL 4 MG/2ML IJ SOLN: 4.0000 mg | Freq: Four times a day (QID) | INTRAMUSCULAR | Status: DC | PRN

## 2017-03-02 MED ORDER — BUPIVACAINE HCL (PF) 0.25 % IJ SOLN
INTRAMUSCULAR | Status: AC
  Filled 2017-03-02: qty 30

## 2017-03-02 MED ORDER — APIXABAN 5 MG PO TABS
5.0000 mg | ORAL_TABLET | Freq: Two times a day (BID) | ORAL | Status: DC
  Administered 2017-03-02: 5 mg via ORAL
  Filled 2017-03-02: qty 1

## 2017-03-02 MED ORDER — LIDOCAINE HCL (CARDIAC) 20 MG/ML IV SOLN
INTRAVENOUS | Status: DC | PRN
  Administered 2017-03-02: 80 mg via INTRAVENOUS

## 2017-03-02 MED ORDER — OFLOXACIN 0.3 % OP SOLN
1.0000 [drp] | Freq: Four times a day (QID) | OPHTHALMIC | Status: DC
  Filled 2017-03-02: qty 5

## 2017-03-02 MED ORDER — SODIUM CHLORIDE 0.9% FLUSH
3.0000 mL | Freq: Two times a day (BID) | INTRAVENOUS | Status: DC
  Administered 2017-03-02: 3 mL via INTRAVENOUS

## 2017-03-02 MED ORDER — MELATONIN 3 MG PO TABS
3.0000 mg | ORAL_TABLET | Freq: Every day | ORAL | Status: DC
  Filled 2017-03-02: qty 1

## 2017-03-02 MED ORDER — PHENYLEPHRINE HCL 10 MG/ML IJ SOLN
INTRAVENOUS | Status: DC | PRN
  Administered 2017-03-02: 35 ug/min via INTRAVENOUS

## 2017-03-02 MED ORDER — IOPAMIDOL (ISOVUE-370) INJECTION 76%
INTRAVENOUS | Status: AC
  Filled 2017-03-02: qty 50

## 2017-03-02 MED ORDER — CYCLOBENZAPRINE HCL 10 MG PO TABS: 10.0000 mg | ORAL_TABLET | Freq: Every day | ORAL | Status: DC | PRN

## 2017-03-02 MED ORDER — SODIUM CHLORIDE 0.9 % IV SOLN: 250.0000 mL | INTRAVENOUS | Status: DC | PRN

## 2017-03-02 SURGICAL SUPPLY — 17 items
BLANKET WARM UNDERBOD FULL ACC (MISCELLANEOUS) ×3 IMPLANT
CATH MAPPNG PENTARAY F 2-6-2MM (CATHETERS) ×1 IMPLANT
CATH NAVISTAR SMARTTOUCH DF (ABLATOR) ×3 IMPLANT
CATH SOUNDSTAR 3D IMAGING (CATHETERS) ×3 IMPLANT
CATH WEBSTER BI DIR CS D-F CRV (CATHETERS) ×3 IMPLANT
HOVERMATT SINGLE USE (MISCELLANEOUS) ×3 IMPLANT
NEEDLE TRANSEP BRK 71CM 407200 (NEEDLE) ×3 IMPLANT
PACK EP LATEX FREE (CUSTOM PROCEDURE TRAY) ×2
PACK EP LF (CUSTOM PROCEDURE TRAY) ×1 IMPLANT
PAD DEFIB LIFELINK (PAD) ×3 IMPLANT
PATCH CARTO3 (PAD) ×3 IMPLANT
PENTARAY F 2-6-2MM (CATHETERS) ×3
SHEATH AVANTI 11F 11CM (SHEATH) ×3 IMPLANT
SHEATH PINNACLE 7F 10CM (SHEATH) ×6 IMPLANT
SHEATH PINNACLE 9F 10CM (SHEATH) ×3 IMPLANT
SHEATH SWARTZ TS SL2 63CM 8.5F (SHEATH) ×3 IMPLANT
TUBING SMART ABLATE COOLFLOW (TUBING) ×3 IMPLANT

## 2017-03-02 NOTE — Transfer of Care (Signed)
Immediate Anesthesia Transfer of Care Note  Patient: Nathan Sutton  Procedure(s) Performed: ATRIAL FIBRILLATION ABLATION (N/A )  Patient Location: PACU  Anesthesia Type:General  Level of Consciousness: awake and drowsy  Airway & Oxygen Therapy: Patient Spontanous Breathing and Patient connected to nasal cannula oxygen  Post-op Assessment: Report given to RN, Post -op Vital signs reviewed and stable and Patient moving all extremities X 4  Post vital signs: Reviewed and stable  Last Vitals:  Vitals:   03/02/17 0604 03/02/17 1035  BP:  131/62  Pulse:  63  Resp: 16 12  Temp:    SpO2:  99%    Last Pain:  Vitals:   03/02/17 0547  TempSrc: Oral         Complications: No apparent anesthesia complications

## 2017-03-02 NOTE — Progress Notes (Signed)
Site area: rt groin fv sheaths x3 Site Prior to Removal:  Level 0 Pressure Applied For: 20 minutes Manual:   yes Patient Status During Pull:  stable Post Pull Site:  Level  0 Post Pull Instructions Given:  yes Post Pull Pulses Present: palpable rt dp Dressing Applied:  Gauze and tegaderm Bedrest begins @ 6808 Comments: IV saline locked

## 2017-03-02 NOTE — H&P (Signed)
Chief Complaint  Patient presents with  . Atrial Fibrillation    consult      History of Present Illness: Nathan Sutton is a 67 y.o. male who presents today for electrophysiology study and ablation for afib.     The patient has had afib for several years.  He has been found to have both afib and also atrial flutter.  He has been treated with metoprolol and flecainide but continues to have afib. He is unaware of triggers/ precipitants.  He reports palpitations and fatigue with his afib.   Today, he denies symptoms of palpitations, chest pain, shortness of breath, orthopnea, PND, lower extremity edema, claudication, dizziness, presyncope, syncope, bleeding, or neurologic sequela. The patient is tolerating medications without difficulties and is otherwise without complaint today.        Past Medical History:  Diagnosis Date  . Atrial fibrillation ([place])   . Diabetes mellitus ([place])   . Dyslipidemia   . Ejection fraction    EF 55-60%, echo, May, 2014  . Heart murmur    As a Child  . Hypertension   . Overweight(278.02)         Past Surgical History:  Procedure Laterality Date  . BACK SURGERY  2011/08           Current Outpatient Medications  Medication Sig Dispense Refill  . amLODipine (NORVASC) 10 MG tablet Take 1 tablet (10 mg total) by mouth daily. 90 tablet 3  . apixaban (ELIQUIS) 5 MG TABS tablet Take 1 tablet (5 mg total) by mouth 2 (two) times daily. 60 tablet 6  . atorvastatin (LIPITOR) 10 MG tablet Take by mouth. 0.5 Tablets 3 days a week    . cetirizine (ZYRTEC) 10 MG tablet Take 10 mg by mouth daily as needed for allergies.    . cyclobenzaprine (FLEXERIL) 10 MG tablet Take 10 mg by mouth as needed for muscle spasms.    . flecainide (TAMBOCOR) 150 MG tablet TAKE 1 TABLET TWICE A DAY 180 tablet 1  . gabapentin (NEURONTIN) 100 MG capsule Take 200 mg by mouth at bedtime.    Marland Kitchen lisinopril-hydrochlorothiazide (PRINZIDE,ZESTORETIC) 20-12.5 MG per  tablet Take 2 tablets by mouth daily.     . Melatonin-Pyridoxine (MELATIN PO) Take 3 mg by mouth.    . metFORMIN (GLUCOPHAGE-XR) 500 MG 24 hr tablet Take 500 mg by mouth daily with breakfast.   1  . metoprolol (TOPROL-XL) 200 MG 24 hr tablet Take 200 mg by mouth daily.    . ranitidine (ZANTAC) 150 MG tablet Take 150 mg by mouth 2 (two) times daily.     No current facility-administered medications for this visit.     Allergies:   Patient has no known allergies.   Social History:  The patient  reports that  has never smoked. he has never used smokeless tobacco.   Family History:  The patient's  family history includes Heart failure in his mother; Stroke in his father.    ROS:  Please see the history of present illness.   All other systems are personally reviewed and negative.    PHYSICAL EXAM: Vitals:   03/02/17 0547 03/02/17 0604  BP: (!) 180/70   Pulse: 62   Resp:  16  Temp: 98.2 F (36.8 C)   SpO2: 100%    GEN: Well nourished, well developed, in no acute distress  HEENT: normal  Neck: no JVD, carotid bruits, or masses Cardiac: RRR; no murmurs, rubs, or gallops,no edema  Respiratory:  clear to  auscultation bilaterally, normal work of breathing GI: soft, nontender, nondistended, + BS MS: no deformity or atrophy  Skin: warm and dry  Neuro:  Strength and sensation are intact Psych: euthymic mood, full affect      Wt Readings from Last 3 Encounters:  01/20/17 227 lb 4 oz (103.1 kg)  12/15/16 232 lb (105.2 kg)  06/08/16 229 lb (103.9 kg)      Other studies personally reviewed: Additional studies/ records that were reviewed today include: Dr Court Joy notes, prior echo  Review of the above records today demonstrates: as above   ASSESSMENT AND PLAN:  1.  Paroxysmal atrial fibrillation and typical appearing atrial flutter The patient has symptomatic atrial arrhythmias.  He has failed medical therapy with flecainide.  Therapeutic strategies  for afib and atrial flutter including medicine and ablation were discussed in detail with the patient today. Risk, benefits, and alternatives to EP study and radiofrequency ablation for afib were also discussed in detail today. These risks include but are not limited to stroke, bleeding, vascular damage, tamponade, perforation, damage to the esophagus, lungs, and other structures, pulmonary vein stenosis, worsening renal function, and death. The patient understands these risk and wishes to proceed at this time. Cardiac CT reviewed with patient.  Pulmonary nodules discussed and he is instructed to follow-up with Dr Quintin Alto for repeat study in 6 months. Reports compliance with eliquis without interruption.  Thompson Grayer MD, [facility] 03/02/2017 7:28 AM

## 2017-03-02 NOTE — Progress Notes (Signed)
Patient ambulated down hallway once, not issues. Right femoral site- level 0. Patient denies any pain or discomfort. IV removed. Patient received discharge instructions with wife at bedside. Educated on new medication and changes. Pt and wife verbalized understanding. Escorted out via wheelchair by Chartered certified accountant.

## 2017-03-02 NOTE — Anesthesia Procedure Notes (Signed)
Procedure Name: Intubation Date/Time: 03/02/2017 7:52 AM Performed by: Inda Coke, CRNA Pre-anesthesia Checklist: Patient identified, Emergency Drugs available, Suction available and Patient being monitored Patient Re-evaluated:Patient Re-evaluated prior to induction Oxygen Delivery Method: Circle System Utilized Preoxygenation: Pre-oxygenation with 100% oxygen Induction Type: IV induction Ventilation: Mask ventilation without difficulty Laryngoscope Size: Mac and 4 Grade View: Grade I Tube type: Oral Number of attempts: 1 Airway Equipment and Method: Stylet and Oral airway Placement Confirmation: ETT inserted through vocal cords under direct vision,  positive ETCO2 and breath sounds checked- equal and bilateral Secured at: 23 cm Tube secured with: Tape Dental Injury: Teeth and Oropharynx as per pre-operative assessment

## 2017-03-02 NOTE — Discharge Summary (Addendum)
ELECTROPHYSIOLOGY PROCEDURE DISCHARGE SUMMARY    Patient ID: Nathan Sutton,  MRN: [id number], DOB/AGE: [birth year] 67 y.o.  Admit date: 03/02/2017 Discharge date: 03/02/2017  Primary Care Physician: Manon Hilding, MD Primary Cardiologist: Bronson Ing Electrophysiologist: Thompson Grayer, MD  Primary Discharge Diagnosis:  Paroxysmal atrial fibrillation and atrial flutter s/p ablation this admission  Secondary Discharge Diagnosis:  1.  HTN 2.  Diabetes  Procedures This Admission:  1.  Electrophysiology study and radiofrequency catheter ablation on 03/02/17 by Dr Thompson Grayer.  This study demonstrated atrial fibrillation upon presentation. Of note, he converted to sinus rhythm spontaneously with a 9.9 second conversion pause; intracardiac echo reveals a moderate to large sized left atrium; successful electrical isolation and anatomical encircling of all four pulmonary veins with radiofrequency current; atrial flutter successfully cardioverted to sinus rhythm; cavo-tricuspid isthmus ablation performed with complete bidirectional isthmus block achieved; no early apparent complications..    Brief HPI: Nathan Sutton is a 67 y.o. male with a history of paroxysmal atrial fibrillation and atrial flutter.  They have failed medical therapy with Flecainide. Risks, benefits, and alternatives to catheter ablation of atrial fibrillation were reviewed with the patient who wished to proceed.  The patient underwent cardiac CT prior to the procedure which demonstrated no LAA thrombus.    Hospital Course:  The patient was admitted and underwent EPS/RFCA of atrial fibrillation with details as outlined above.  They were monitored on telemetry which demonstrated sinus rhythm.  Groin was without complication on the day of discharge.  The patient was examined and considered to be stable for discharge.  Wound care and restrictions were reviewed with the patient.  The patient will be seen back by Roderic Palau, NP in 4  weeks and Dr Rayann Heman in 12 weeks for post ablation follow up.   This patients CHA2DS2-VASc Score and unadjusted Ischemic Stroke Rate (% per year) is equal to 3.2 % stroke rate/year from a score of 3 Above score calculated as 1 point each if present [CHF, HTN, DM, Vascular=MI/PAD/Aortic Plaque, Age if 65-74, or Male] Above score calculated as 2 points each if present [Age > 75, or Stroke/TIA/TE]   Physical Exam: Vitals:   03/02/17 1110 03/02/17 1119 03/02/17 1146 03/02/17 1214  BP: 126/68  (!) 156/82 134/73  Pulse: 63  70 67  Resp: 15  18 13   Temp:  (!) 97.5 F (36.4 C) 97.6 F (36.4 C)   TempSrc:  Temporal Oral   SpO2: 96%  95% 92%  Weight:   226 lb (102.5 kg)   Height:   6\' 1"  (1.854 m)      Labs:   Lab Results  Component Value Date   WBC 7.1 02/16/2017   HGB 15.1 02/16/2017   HCT 43.7 02/16/2017   MCV 90 02/16/2017   PLT 283 02/16/2017   No results for input(s): NA, K, CL, CO2, BUN, CREATININE, CALCIUM, PROT, BILITOT, ALKPHOS, ALT, AST, GLUCOSE in the last 168 hours.  Invalid input(s): LABALBU   Discharge Medications:  Allergies as of 03/02/2017   No Known Allergies     Medication List    STOP taking these medications   ranitidine 150 MG tablet Commonly known as:  ZANTAC     TAKE these medications   amLODipine 10 MG tablet Commonly known as:  NORVASC Take 1 tablet (10 mg total) by mouth daily.   apixaban 5 MG Tabs tablet Commonly known as:  ELIQUIS Take 1 tablet (5 mg total) by mouth 2 (two) times daily.  atorvastatin 10 MG tablet Commonly known as:  LIPITOR Take 5 mg by mouth every Monday, Wednesday, and Friday.   cetirizine 10 MG tablet Commonly known as:  ZYRTEC Take 10 mg by mouth daily as needed for allergies.   CORICIDIN HBP COUGH/COLD PO Take 1 tablet by mouth 2 (two) times daily as needed (cold symptoms).   cyclobenzaprine 10 MG tablet Commonly known as:  FLEXERIL Take 10 mg by mouth daily as needed for muscle spasms.   flecainide  150 MG tablet Commonly known as:  TAMBOCOR TAKE 1 TABLET TWICE A DAY   gabapentin 100 MG capsule Commonly known as:  NEURONTIN Take 300 mg by mouth at bedtime.   lisinopril-hydrochlorothiazide 20-12.5 MG tablet Commonly known as:  PRINZIDE,ZESTORETIC Take 2 tablets by mouth daily.   Melatonin 3 MG Tabs Take 3 mg by mouth at bedtime.   metFORMIN 500 MG 24 hr tablet Commonly known as:  GLUCOPHAGE-XR Take 1,000 mg by mouth 2 (two) times daily.   metoprolol succinate 100 MG 24 hr tablet Commonly known as:  TOPROL-XL Take 1 tablet (100 mg total) by mouth daily. What changed:    medication strength  how much to take   ofloxacin 0.3 % ophthalmic solution Commonly known as:  OCUFLOX Place 1 drop into the left eye 4 (four) times daily.   pantoprazole 40 MG tablet Commonly known as:  PROTONIX Take 1 tablet (40 mg total) by mouth daily.   REFRESH OP Place 1 drop into the right eye 4 (four) times daily.       Disposition:  Discharge Instructions    Diet - low sodium heart healthy   Complete by:  As directed    Increase activity slowly   Complete by:  As directed      Follow-up Information    [facility] Follow up on 03/30/2017.   Specialty:  Cardiology Why:  at [facility] information: [address] [id number] [place] [address] [phone]       Thompson Grayer, MD Follow up on 06/05/2017.   Specialty:  Cardiology Why:  at [facility] information: [place] [address] [phone]           Duration of Discharge Encounter: Greater than 30 minutes including physician time.  Signed, Chanetta Marshall, NP 03/02/2017 3:21 PM  I have seen, examined the patient, and reviewed the above assessment and plan.  Changes to above are made where necessary.  Doing well s/p ablation.  VSS.  No concerns.  DC to home. Reduce toprol to 100mg  daily. Routine follow-up   Co Sign: Thompson Grayer,  MD 03/02/2017

## 2017-03-02 NOTE — Anesthesia Postprocedure Evaluation (Signed)
Anesthesia Post Note  Patient: Nathan Sutton  Procedure(s) Performed: ATRIAL FIBRILLATION ABLATION (N/A )     Patient location during evaluation: PACU Anesthesia Type: General Level of consciousness: sedated Pain management: pain level controlled Vital Signs Assessment: post-procedure vital signs reviewed and stable Respiratory status: spontaneous breathing and respiratory function stable Cardiovascular status: stable Postop Assessment: no apparent nausea or vomiting Anesthetic complications: no    Last Vitals:  Vitals:   03/02/17 1146 03/02/17 1214  BP: (!) 156/82 134/73  Pulse: 70 67  Resp: 18 13  Temp: 36.4 C   SpO2: 95% 92%    Last Pain:  Vitals:   03/02/17 1146  TempSrc: Oral                 Finas Delone DANIEL

## 2017-03-06 DIAGNOSIS — Z0001 Encounter for general adult medical examination with abnormal findings: Secondary | ICD-10-CM | POA: Diagnosis not present

## 2017-03-06 DIAGNOSIS — Z1211 Encounter for screening for malignant neoplasm of colon: Secondary | ICD-10-CM | POA: Diagnosis not present

## 2017-03-06 DIAGNOSIS — I482 Chronic atrial fibrillation: Secondary | ICD-10-CM | POA: Diagnosis not present

## 2017-03-06 DIAGNOSIS — I10 Essential (primary) hypertension: Secondary | ICD-10-CM | POA: Diagnosis not present

## 2017-03-06 DIAGNOSIS — Z6829 Body mass index (BMI) 29.0-29.9, adult: Secondary | ICD-10-CM | POA: Diagnosis not present

## 2017-03-06 DIAGNOSIS — E1129 Type 2 diabetes mellitus with other diabetic kidney complication: Secondary | ICD-10-CM | POA: Diagnosis not present

## 2017-03-06 DIAGNOSIS — E1165 Type 2 diabetes mellitus with hyperglycemia: Secondary | ICD-10-CM | POA: Diagnosis not present

## 2017-03-06 DIAGNOSIS — E782 Mixed hyperlipidemia: Secondary | ICD-10-CM | POA: Diagnosis not present

## 2017-03-09 ENCOUNTER — Other Ambulatory Visit: Payer: Self-pay

## 2017-03-09 ENCOUNTER — Encounter: Payer: Self-pay | Admitting: Internal Medicine

## 2017-03-09 MED ORDER — METOPROLOL SUCCINATE ER 100 MG PO TB24: 100.0000 mg | ORAL_TABLET | Freq: Every day | ORAL | 3 refills | Status: DC

## 2017-03-09 MED ORDER — APIXABAN 5 MG PO TABS: 5.0000 mg | ORAL_TABLET | Freq: Two times a day (BID) | ORAL | 3 refills | Status: DC

## 2017-03-10 ENCOUNTER — Other Ambulatory Visit: Payer: Self-pay

## 2017-03-10 MED ORDER — PANTOPRAZOLE SODIUM 40 MG PO TBEC: 40.0000 mg | DELAYED_RELEASE_TABLET | Freq: Every day | ORAL | 3 refills | Status: DC

## 2017-03-10 MED ORDER — METOPROLOL SUCCINATE ER 100 MG PO TB24: 100.0000 mg | ORAL_TABLET | Freq: Every day | ORAL | 3 refills | Status: DC

## 2017-03-30 ENCOUNTER — Encounter (HOSPITAL_COMMUNITY): Payer: Medicare Other | Admitting: Nurse Practitioner

## 2017-03-31 DIAGNOSIS — G629 Polyneuropathy, unspecified: Secondary | ICD-10-CM | POA: Diagnosis not present

## 2017-03-31 DIAGNOSIS — E1143 Type 2 diabetes mellitus with diabetic autonomic (poly)neuropathy: Secondary | ICD-10-CM | POA: Diagnosis not present

## 2017-03-31 DIAGNOSIS — N189 Chronic kidney disease, unspecified: Secondary | ICD-10-CM | POA: Diagnosis not present

## 2017-03-31 DIAGNOSIS — Z6828 Body mass index (BMI) 28.0-28.9, adult: Secondary | ICD-10-CM | POA: Diagnosis not present

## 2017-03-31 DIAGNOSIS — M545 Low back pain: Secondary | ICD-10-CM | POA: Diagnosis not present

## 2017-04-04 ENCOUNTER — Encounter (HOSPITAL_COMMUNITY): Payer: Medicare Other | Admitting: Nurse Practitioner

## 2017-04-10 DIAGNOSIS — H01005 Unspecified blepharitis left lower eyelid: Secondary | ICD-10-CM | POA: Diagnosis not present

## 2017-04-10 DIAGNOSIS — H04123 Dry eye syndrome of bilateral lacrimal glands: Secondary | ICD-10-CM | POA: Diagnosis not present

## 2017-04-10 DIAGNOSIS — H16223 Keratoconjunctivitis sicca, not specified as Sjogren's, bilateral: Secondary | ICD-10-CM | POA: Diagnosis not present

## 2017-04-10 DIAGNOSIS — H01004 Unspecified blepharitis left upper eyelid: Secondary | ICD-10-CM | POA: Diagnosis not present

## 2017-04-10 DIAGNOSIS — H01001 Unspecified blepharitis right upper eyelid: Secondary | ICD-10-CM | POA: Diagnosis not present

## 2017-04-10 DIAGNOSIS — H01002 Unspecified blepharitis right lower eyelid: Secondary | ICD-10-CM | POA: Diagnosis not present

## 2017-04-12 ENCOUNTER — Ambulatory Visit (HOSPITAL_COMMUNITY)
Admission: RE | Admit: 2017-04-12 | Discharge: 2017-04-12 | Disposition: A | Discharge status: Home / Self Care | Payer: Medicare Other | Source: Ambulatory Visit | Attending: Nurse Practitioner | Admitting: Nurse Practitioner

## 2017-04-12 ENCOUNTER — Encounter (HOSPITAL_COMMUNITY): Payer: Self-pay | Admitting: Nurse Practitioner

## 2017-04-12 VITALS — BP 140/78 | HR 66 | Ht 73.0 in | Wt 220.0 lb

## 2017-04-12 DIAGNOSIS — Z7984 Long term (current) use of oral hypoglycemic drugs: Secondary | ICD-10-CM | POA: Diagnosis not present

## 2017-04-12 DIAGNOSIS — I480 Paroxysmal atrial fibrillation: Secondary | ICD-10-CM | POA: Diagnosis not present

## 2017-04-12 DIAGNOSIS — Z79899 Other long term (current) drug therapy: Secondary | ICD-10-CM | POA: Insufficient documentation

## 2017-04-12 DIAGNOSIS — E785 Hyperlipidemia, unspecified: Secondary | ICD-10-CM | POA: Insufficient documentation

## 2017-04-12 DIAGNOSIS — Z9889 Other specified postprocedural states: Secondary | ICD-10-CM | POA: Insufficient documentation

## 2017-04-12 DIAGNOSIS — Z8249 Family history of ischemic heart disease and other diseases of the circulatory system: Secondary | ICD-10-CM | POA: Diagnosis not present

## 2017-04-12 DIAGNOSIS — I10 Essential (primary) hypertension: Secondary | ICD-10-CM | POA: Insufficient documentation

## 2017-04-12 DIAGNOSIS — Z823 Family history of stroke: Secondary | ICD-10-CM | POA: Insufficient documentation

## 2017-04-12 DIAGNOSIS — Z7901 Long term (current) use of anticoagulants: Secondary | ICD-10-CM | POA: Insufficient documentation

## 2017-04-12 DIAGNOSIS — E119 Type 2 diabetes mellitus without complications: Secondary | ICD-10-CM | POA: Insufficient documentation

## 2017-04-12 NOTE — Progress Notes (Signed)
Primary Care Physician: Manon Hilding, MD Referring Physician: Dr. Guadalupe Dawn is a 67 y.o. male with a h/o afib that is in the afib clinic for f/u ablation. He reports that he has done very well since the procedure. He has not noted any afib. No swallowing or groin issues. He continues on flecainide 150 mg bid.    Today, he denies symptoms of palpitations, chest pain, shortness of breath, orthopnea, PND, lower extremity edema, dizziness, presyncope, syncope, or neurologic sequela. The patient is tolerating medications without difficulties and is otherwise without complaint today.   Past Medical History:  Diagnosis Date  . Atrial fibrillation ([place])   . Dyslipidemia   . Ejection fraction    EF 55-60%, echo, May, 2014  . Heart murmur    As a Child  . Hypertension   . Overweight(278.02)   . Type II diabetes mellitus ([place])    Past Surgical History:  Procedure Laterality Date  . ATRIAL FIBRILLATION ABLATION N/A 03/02/2017   Procedure: ATRIAL FIBRILLATION ABLATION;  Surgeon: Thompson Grayer, MD;  Location: [facility];  Service: Cardiovascular;  Laterality: N/A;  . BACK SURGERY    . DECOMPRESSIVE LUMBAR LAMINECTOMY LEVEL 2 Right 08/2009   L3-4; L4-5/notes 08/22/2009    Current Outpatient Medications  Medication Sig Dispense Refill  . amLODipine (NORVASC) 10 MG tablet Take 1 tablet (10 mg total) by mouth daily. 90 tablet 3  . apixaban (ELIQUIS) 5 MG TABS tablet Take 1 tablet (5 mg total) by mouth 2 (two) times daily. 180 tablet 3  . atorvastatin (LIPITOR) 10 MG tablet Take 5 mg by mouth every Monday, Wednesday, and Friday.     . cetirizine (ZYRTEC) 10 MG tablet Take 10 mg by mouth daily as needed for allergies.    . Chlorpheniramine-DM (CORICIDIN HBP COUGH/COLD PO) Take 1 tablet by mouth 2 (two) times daily as needed (cold symptoms).    . cyclobenzaprine (FLEXERIL) 10 MG tablet Take 10 mg by mouth daily as needed for muscle spasms.    . flecainide (TAMBOCOR) 150 MG  tablet TAKE 1 TABLET TWICE A DAY 180 tablet 1  . gabapentin (NEURONTIN) 100 MG capsule Take 300 mg by mouth at bedtime.     Marland Kitchen lisinopril-hydrochlorothiazide (PRINZIDE,ZESTORETIC) 20-12.5 MG per tablet Take 2 tablets by mouth daily.     . Melatonin 3 MG TABS Take 3 mg by mouth at bedtime.    . metFORMIN (GLUCOPHAGE-XR) 500 MG 24 hr tablet Take 1,000 mg by mouth 2 (two) times daily.   1  . metoprolol succinate (TOPROL-XL) 100 MG 24 hr tablet Take 1 tablet (100 mg total) by mouth daily. 90 tablet 3  . ofloxacin (OCUFLOX) 0.3 % ophthalmic solution Place 1 drop into the left eye 4 (four) times daily.  3  . pantoprazole (PROTONIX) 40 MG tablet Take 1 tablet (40 mg total) by mouth daily. 90 tablet 3  . Polyvinyl Alcohol-Povidone (REFRESH OP) Place 1 drop into the right eye 4 (four) times daily.     No current facility-administered medications for this encounter.     No Known Allergies  Social History   Socioeconomic History  . Marital status: Married    Spouse name: Not on file  . Number of children: Not on file  . Years of education: Not on file  . Highest education level: Not on file  Occupational History  . Not on file  Social Needs  . Financial resource strain: Not on file  .  Food insecurity:    Worry: Not on file    Inability: Not on file  . Transportation needs:    Medical: Not on file    Non-medical: Not on file  Tobacco Use  . Smoking status: Never Smoker  . Smokeless tobacco: Never Used  Substance and Sexual Activity  . Alcohol use: No    Alcohol/week: 0.0 oz    Frequency: Never  . Drug use: No  . Sexual activity: Not Currently  Lifestyle  . Physical activity:    Days per week: Not on file    Minutes per session: Not on file  . Stress: Not on file  Relationships  . Social connections:    Talks on phone: Not on file    Gets together: Not on file    Attends religious service: Not on file    Active member of club or organization: Not on file    Attends meetings of  clubs or organizations: Not on file    Relationship status: Not on file  . Intimate partner violence:    Fear of current or ex partner: Not on file    Emotionally abused: Not on file    Physically abused: Not on file    Forced sexual activity: Not on file  Other Topics Concern  . Not on file  Social History Narrative  . Not on file    Family History  Problem Relation Age of Onset  . Heart failure Mother   . Stroke Father     ROS- All systems are reviewed and negative except as per the HPI above  Physical Exam: Vitals:   04/12/17 1350  BP: 140/78  Pulse: 66  Weight: 220 lb (99.8 kg)  Height: 6\' 1"  (1.854 m)   Wt Readings from Last 3 Encounters:  04/12/17 220 lb (99.8 kg)  03/02/17 226 lb (102.5 kg)  01/20/17 227 lb 4 oz (103.1 kg)    Labs: Lab Results  Component Value Date   NA 142 02/16/2017   K 3.9 02/16/2017   CL 100 02/16/2017   CO2 27 02/16/2017   GLUCOSE 138 (H) 02/16/2017   BUN 12 02/16/2017   CREATININE 1.00 02/16/2017   CALCIUM 9.8 02/16/2017   Lab Results  Component Value Date   INR 0.91 08/17/2009   No results found for: CHOL, HDL, LDLCALC, TRIG   GEN- The patient is well appearing, alert and oriented x 3 today.   Head- normocephalic, atraumatic Eyes-  Sclera clear, conjunctiva pink Ears- hearing intact Oropharynx- clear Neck- supple, no JVP Lymph- no cervical lymphadenopathy Lungs- Clear to ausculation bilaterally, normal work of breathing Heart- Regular rate and rhythm, no murmurs, rubs or gallops, PMI not laterally displaced GI- soft, NT, ND, + BS Extremities- no clubbing, cyanosis, or edema MS- no significant deformity or atrophy Skin- no rash or lesion Psych- euthymic mood, full affect Neuro- strength and sensation are intact  EKG-SR with first degree AV block, Pr int 214 ms, qrs int 100 ms, qtc 438 ms  Epic records reviewed    Assessment and Plan: 1. Paroxysmal afib S/p ablation Doing well, maintaining SR Continue  flecainide 150 mg bid Continue eliquis 5 mg bid for chadsvasc score of 3, reminded not to miss doses during the recovery period  2. HTN Stable  F/u with Dr. Rayann Heman 6/3  Geroge Baseman. Izumi Mixon, [facility] [address] [place], [place] [address] [phone]

## 2017-05-16 ENCOUNTER — Encounter: Payer: Self-pay | Admitting: Internal Medicine

## 2017-05-19 DIAGNOSIS — J301 Allergic rhinitis due to pollen: Secondary | ICD-10-CM | POA: Diagnosis not present

## 2017-05-19 DIAGNOSIS — Z6829 Body mass index (BMI) 29.0-29.9, adult: Secondary | ICD-10-CM | POA: Diagnosis not present

## 2017-06-05 ENCOUNTER — Encounter: Payer: Self-pay | Admitting: Internal Medicine

## 2017-06-05 ENCOUNTER — Ambulatory Visit (INDEPENDENT_AMBULATORY_CARE_PROVIDER_SITE_OTHER): Payer: Medicare Other | Admitting: Internal Medicine

## 2017-06-05 VITALS — BP 132/86 | HR 59 | Ht 73.0 in | Wt 221.0 lb

## 2017-06-05 DIAGNOSIS — I480 Paroxysmal atrial fibrillation: Secondary | ICD-10-CM

## 2017-06-05 DIAGNOSIS — I10 Essential (primary) hypertension: Secondary | ICD-10-CM | POA: Diagnosis not present

## 2017-06-05 DIAGNOSIS — I4892 Unspecified atrial flutter: Secondary | ICD-10-CM | POA: Diagnosis not present

## 2017-06-05 MED ORDER — METOPROLOL SUCCINATE ER 50 MG PO TB24: 50.0000 mg | ORAL_TABLET | Freq: Every day | ORAL | 3 refills | Status: DC

## 2017-06-05 MED ORDER — FLECAINIDE ACETATE 150 MG PO TABS: 75.0000 mg | ORAL_TABLET | Freq: Two times a day (BID) | ORAL | 0 refills | Status: DC

## 2017-06-05 NOTE — Progress Notes (Signed)
PCP: Manon Hilding, MD Primary Cardiologist: Dr Bronson Ing Primary EP: Dr Guadalupe Dawn is a 67 y.o. male who presents today for routine electrophysiology followup.  Since his afib ablation, the patient reports doing very well.  Denies procedure related complications.  Pleased with results.  No further afib.  Today, he denies symptoms of palpitations, chest pain, shortness of breath,  dizziness, presyncope, or syncope.  + stable mild edema.  The patient is otherwise without complaint today.   Past Medical History:  Diagnosis Date  . Atrial fibrillation ([place])   . Dyslipidemia   . Ejection fraction    EF 55-60%, echo, May, 2014  . Heart murmur    As a Child  . Hypertension   . Overweight(278.02)   . Type II diabetes mellitus ([place])    Past Surgical History:  Procedure Laterality Date  . ATRIAL FIBRILLATION ABLATION N/A 03/02/2017   Procedure: ATRIAL FIBRILLATION ABLATION;  Surgeon: Thompson Grayer, MD;  Location: [facility];  Service: Cardiovascular;  Laterality: N/A;  . BACK SURGERY    . DECOMPRESSIVE LUMBAR LAMINECTOMY LEVEL 2 Right 08/2009   L3-4; L4-5/notes 08/22/2009    ROS- all systems are reviewed and negatives except as per HPI above  Current Outpatient Medications  Medication Sig Dispense Refill  . amLODipine (NORVASC) 10 MG tablet Take 1 tablet (10 mg total) by mouth daily. 90 tablet 3  . apixaban (ELIQUIS) 5 MG TABS tablet Take 1 tablet (5 mg total) by mouth 2 (two) times daily. 180 tablet 3  . atorvastatin (LIPITOR) 10 MG tablet Take 5 mg by mouth every Monday, Wednesday, and Friday.     . cetirizine (ZYRTEC) 10 MG tablet Take 10 mg by mouth daily as needed for allergies.    . Chlorpheniramine-DM (CORICIDIN HBP COUGH/COLD PO) Take 1 tablet by mouth 2 (two) times daily as needed (cold symptoms).    . cyclobenzaprine (FLEXERIL) 10 MG tablet Take 10 mg by mouth daily as needed for muscle spasms.    . flecainide (TAMBOCOR) 150 MG tablet TAKE 1 TABLET TWICE A  DAY 180 tablet 1  . gabapentin (NEURONTIN) 100 MG capsule Take 300 mg by mouth at bedtime.     Marland Kitchen lisinopril-hydrochlorothiazide (PRINZIDE,ZESTORETIC) 20-12.5 MG per tablet Take 2 tablets by mouth daily.     . Melatonin 3 MG TABS Take 3 mg by mouth at bedtime.    . metFORMIN (GLUCOPHAGE-XR) 500 MG 24 hr tablet Take 1,000 mg by mouth 2 (two) times daily.   1  . metoprolol succinate (TOPROL-XL) 100 MG 24 hr tablet Take 1 tablet (100 mg total) by mouth daily. 90 tablet 3  . ofloxacin (OCUFLOX) 0.3 % ophthalmic solution Place 1 drop into the left eye 4 (four) times daily.  3  . pantoprazole (PROTONIX) 40 MG tablet Take 1 tablet (40 mg total) by mouth daily. 90 tablet 3  . Polyvinyl Alcohol-Povidone (REFRESH OP) Place 1 drop into the right eye 4 (four) times daily.     No current facility-administered medications for this visit.     Physical Exam: Vitals:   06/05/17 1007  BP: 132/86  Pulse: (!) 59  Weight: 221 lb (100.2 kg)  Height: 6\' 1"  (1.854 m)    GEN- The patient is well appearing, alert and oriented x 3 today.   Head- normocephalic, atraumatic Eyes-  Sclera clear, conjunctiva pink Ears- hearing intact Oropharynx- clear Lungs- Clear to ausculation bilaterally, normal work of breathing Heart- Regular rate and rhythm, no murmurs,  rubs or gallops, PMI not laterally displaced GI- soft, NT, ND, + BS Extremities- no clubbing, cyanosis, +trace R>L edema  Wt Readings from Last 3 Encounters:  06/05/17 221 lb (100.2 kg)  04/12/17 220 lb (99.8 kg)  03/02/17 226 lb (102.5 kg)    EKG tracing ordered today is personally reviewed and shows sinus rhythm 59 bpm, PR 204 msec, QRS 96 msec, QTc 427 msec  Assessment and Plan:  1. Paroxysmal atrial fibrillation/ atrial flutter Doing well s/p ablation chads2vasc score is 3.  Continue on eliquis Reduce  Flecainide to 75mg  BID x 4 weeks then stop it Reduce toprol to 50mg  daily  2. HTN  Stable No change required today  3. Overweight Body  mass index is 29.16 kg/m. Wt Readings from Last 3 Encounters:  06/05/17 221 lb (100.2 kg)  04/12/17 220 lb (99.8 kg)  03/02/17 226 lb (102.5 kg)   Return to see me in 3 months  Thompson Grayer MD, [facility] 06/05/2017 10:34 AM

## 2017-06-05 NOTE — Patient Instructions (Addendum)
Medication Instructions:  Your physician has recommended you make the following change in your medication:  1.  Decrease your flecainide 150 mg to 1/2 tablet twice a day for 4 weeks then stop. 2.  Decrease your Toprol XL to 50 mg daily.  Labwork: None ordered.  Testing/Procedures: None ordered.  Follow-Up: Your physician wants you to follow-up in: 3 months with Dr. Rayann Heman.      Any Other Special Instructions Will Be Listed Below (If Applicable).  If you need a refill on your cardiac medications before your next appointment, please call your pharmacy.

## 2017-07-03 DIAGNOSIS — M545 Low back pain: Secondary | ICD-10-CM | POA: Diagnosis not present

## 2017-07-03 DIAGNOSIS — E782 Mixed hyperlipidemia: Secondary | ICD-10-CM | POA: Diagnosis not present

## 2017-07-03 DIAGNOSIS — E1129 Type 2 diabetes mellitus with other diabetic kidney complication: Secondary | ICD-10-CM | POA: Diagnosis not present

## 2017-07-03 DIAGNOSIS — I10 Essential (primary) hypertension: Secondary | ICD-10-CM | POA: Diagnosis not present

## 2017-07-03 DIAGNOSIS — E1165 Type 2 diabetes mellitus with hyperglycemia: Secondary | ICD-10-CM | POA: Diagnosis not present

## 2017-07-03 DIAGNOSIS — E1143 Type 2 diabetes mellitus with diabetic autonomic (poly)neuropathy: Secondary | ICD-10-CM | POA: Diagnosis not present

## 2017-07-05 DIAGNOSIS — Z1331 Encounter for screening for depression: Secondary | ICD-10-CM | POA: Diagnosis not present

## 2017-07-05 DIAGNOSIS — F5101 Primary insomnia: Secondary | ICD-10-CM | POA: Diagnosis not present

## 2017-07-05 DIAGNOSIS — E1165 Type 2 diabetes mellitus with hyperglycemia: Secondary | ICD-10-CM | POA: Diagnosis not present

## 2017-07-05 DIAGNOSIS — Z1389 Encounter for screening for other disorder: Secondary | ICD-10-CM | POA: Diagnosis not present

## 2017-07-05 DIAGNOSIS — R800 Isolated proteinuria: Secondary | ICD-10-CM | POA: Diagnosis not present

## 2017-07-05 DIAGNOSIS — E1129 Type 2 diabetes mellitus with other diabetic kidney complication: Secondary | ICD-10-CM | POA: Diagnosis not present

## 2017-07-05 DIAGNOSIS — E782 Mixed hyperlipidemia: Secondary | ICD-10-CM | POA: Diagnosis not present

## 2017-07-05 DIAGNOSIS — I482 Chronic atrial fibrillation: Secondary | ICD-10-CM | POA: Diagnosis not present

## 2017-07-05 DIAGNOSIS — Z6828 Body mass index (BMI) 28.0-28.9, adult: Secondary | ICD-10-CM | POA: Diagnosis not present

## 2017-07-09 ENCOUNTER — Other Ambulatory Visit: Payer: Self-pay | Admitting: Cardiovascular Disease

## 2017-08-06 ENCOUNTER — Encounter: Payer: Self-pay | Admitting: Internal Medicine

## 2017-08-07 ENCOUNTER — Telehealth: Payer: Self-pay | Admitting: Internal Medicine

## 2017-08-07 NOTE — Telephone Encounter (Signed)
Pt calling back to follow up on this message-pls call [phone]

## 2017-08-07 NOTE — Telephone Encounter (Signed)
New message    Patient calling to request a response from MyChart message. He is having computer issues. Please call

## 2017-08-07 NOTE — Telephone Encounter (Signed)
Returned call to Pt.  Per Pt he has continued to feel unwell since Saturday-he thinks he went out of rhythm at that time.  States his heart rates are 100's and greater.  When in SR his heart rates are in the 60's.    Advised Pt that he probably needs to be seen by the Afib clinic (per Dr. Rayann Heman if continued to feel bad).    Pt would like to see Koneswaran in [place] if possible. Pt will call [place] office tomorrow AM, if no appointments this week Pt will call afib clinic.  Phone # given to afib clinic.  Pt thanked nurse for return call.

## 2017-08-08 ENCOUNTER — Telehealth: Payer: Self-pay | Admitting: Internal Medicine

## 2017-08-08 NOTE — Telephone Encounter (Signed)
Apt scheduled for 8/7 at 8:20 per [address] office note.

## 2017-08-08 NOTE — Telephone Encounter (Signed)
Pulse rate has been high and was told by [facility]. To contact our office.

## 2017-08-09 ENCOUNTER — Ambulatory Visit (INDEPENDENT_AMBULATORY_CARE_PROVIDER_SITE_OTHER): Payer: Medicare Other | Admitting: Cardiovascular Disease

## 2017-08-09 ENCOUNTER — Telehealth: Payer: Self-pay | Admitting: Cardiovascular Disease

## 2017-08-09 ENCOUNTER — Encounter: Payer: Self-pay | Admitting: Cardiovascular Disease

## 2017-08-09 ENCOUNTER — Other Ambulatory Visit: Payer: Self-pay

## 2017-08-09 ENCOUNTER — Other Ambulatory Visit: Payer: Self-pay | Admitting: Cardiovascular Disease

## 2017-08-09 ENCOUNTER — Encounter: Payer: Self-pay | Admitting: *Deleted

## 2017-08-09 ENCOUNTER — Encounter (HOSPITAL_COMMUNITY): Payer: Self-pay

## 2017-08-09 ENCOUNTER — Other Ambulatory Visit: Payer: Self-pay | Admitting: Cardiology

## 2017-08-09 VITALS — BP 134/86 | HR 128 | Ht 73.0 in | Wt 223.0 lb

## 2017-08-09 DIAGNOSIS — I10 Essential (primary) hypertension: Secondary | ICD-10-CM | POA: Diagnosis not present

## 2017-08-09 DIAGNOSIS — Z7189 Other specified counseling: Secondary | ICD-10-CM | POA: Diagnosis not present

## 2017-08-09 DIAGNOSIS — I480 Paroxysmal atrial fibrillation: Secondary | ICD-10-CM | POA: Diagnosis not present

## 2017-08-09 DIAGNOSIS — I4892 Unspecified atrial flutter: Secondary | ICD-10-CM

## 2017-08-09 LAB — BASIC METABOLIC PANEL WITH GFR
BUN: 13 mg/dL (ref 7–25)
CALCIUM: 10.5 mg/dL — AB (ref 8.6–10.3)
CHLORIDE: 99 mmol/L (ref 98–110)
CO2: 30 mmol/L (ref 20–32)
Creat: 1.14 mg/dL (ref 0.70–1.25)
GFR, Est African American: 77 mL/min/{1.73_m2} (ref >=60)
GFR, Est Non African American: 67 mL/min/{1.73_m2} (ref >=60)
Glucose, Bld: 184 mg/dL — ABNORMAL HIGH (ref 65–139)
POTASSIUM: 4.3 mmol/L (ref 3.5–5.3)
Sodium: 139 mmol/L (ref 135–146)

## 2017-08-09 LAB — CBC
HCT: 44.1 % (ref 38.5–50.0)
HEMOGLOBIN: 15.6 g/dL (ref 13.2–17.1)
MCH: 30.9 pg (ref 27.0–33.0)
MCHC: 35.4 g/dL (ref 32.0–36.0)
MCV: 87.3 fL (ref 80.0–100.0)
MPV: 12.1 fL (ref 7.5–12.5)
PLATELETS: 307 10*3/uL (ref 140–400)
RBC: 5.05 10*6/uL (ref 4.20–5.80)
RDW: 12.7 % (ref 11.0–15.0)
WBC: 7.8 10*3/uL (ref 3.8–10.8)

## 2017-08-09 MED ORDER — METOPROLOL SUCCINATE ER 50 MG PO TB24: 50.0000 mg | ORAL_TABLET | Freq: Two times a day (BID) | ORAL | 3 refills | Status: DC

## 2017-08-09 NOTE — H&P (View-Only) (Signed)
SUBJECTIVE: The patient presents for evaluation of atrial fibrillation.  He called our office on 8/5 stating that he continued to feel unwell and thought he went into atrial fibrillation.  He said his heart rates were higher than 100 bpm. He underwent atrial fibrillation ablation.  On 06/05/2017 when he saw Dr. Rayann Heman in the office, he was instructed to take flecainide 75 mg twice daily for 4 weeks and then stop it and reduce Toprol-XL to 50 mg daily.  ECG performed in the office today which I ordered and personally interpreted demonstrates what appears to be rapid atrial flutter, 131 bpm.  He started feeling unwell on 8/3 and noticed heart rates were climbing to the 80s to low 100 bpm range.  Over the course of the next few days his heart rate gradually increased up to the 120-130 range.  He denies any lightheadedness, dizziness, and syncope.  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  It appears he missed a dose of Eliquis on the Saturday before last.   Review of Systems: As per "subjective", otherwise negative.  No Known Allergies  Current Outpatient Medications  Medication Sig Dispense Refill  . amLODipine (NORVASC) 10 MG tablet TAKE 1 TABLET DAILY 90 tablet 0  . apixaban (ELIQUIS) 5 MG TABS tablet Take 1 tablet (5 mg total) by mouth 2 (two) times daily. 180 tablet 3  . atorvastatin (LIPITOR) 10 MG tablet Take 5 mg by mouth every Monday, Wednesday, and Friday.     . cetirizine (ZYRTEC) 10 MG tablet Take 10 mg by mouth daily as needed for allergies.    . Chlorpheniramine-DM (CORICIDIN HBP COUGH/COLD PO) Take 1 tablet by mouth 2 (two) times daily as needed (cold symptoms).    . cyclobenzaprine (FLEXERIL) 10 MG tablet Take 10 mg by mouth daily as needed for muscle spasms.    Marland Kitchen gabapentin (NEURONTIN) 100 MG capsule Take 300 mg by mouth at bedtime.     Marland Kitchen lisinopril-hydrochlorothiazide (PRINZIDE,ZESTORETIC) 20-12.5 MG per tablet Take 2  tablets by mouth daily.     . Melatonin 3 MG TABS Take 3 mg by mouth at bedtime.    . metFORMIN (GLUCOPHAGE-XR) 500 MG 24 hr tablet Take 1,000 mg by mouth 2 (two) times daily.   1  . metoprolol succinate (TOPROL-XL) 50 MG 24 hr tablet Take 1 tablet (50 mg total) by mouth daily. Take with or immediately following a meal. 90 tablet 3  . ofloxacin (OCUFLOX) 0.3 % ophthalmic solution Place 1 drop into the left eye 4 (four) times daily.  3  . pantoprazole (PROTONIX) 40 MG tablet Take 1 tablet (40 mg total) by mouth daily. 90 tablet 3  . Polyvinyl Alcohol-Povidone (REFRESH OP) Place 1 drop into the right eye 4 (four) times daily.     No current facility-administered medications for this visit.     Past Medical History:  Diagnosis Date  . Atrial fibrillation ([place])   . Dyslipidemia   . Ejection fraction    EF 55-60%, echo, May, 2014  . Heart murmur    As a Child  . Hypertension   . Overweight(278.02)   . Type II diabetes mellitus ([place])     Past Surgical History:  Procedure Laterality Date  . ATRIAL FIBRILLATION ABLATION N/A 03/02/2017   Procedure: ATRIAL FIBRILLATION ABLATION;  Surgeon: Thompson Grayer, MD;  Location: [facility];  Service: Cardiovascular;  Laterality: N/A;  . BACK SURGERY    .  DECOMPRESSIVE LUMBAR LAMINECTOMY LEVEL 2 Right 08/2009   L3-4; L4-5/notes 08/22/2009    Social History   Socioeconomic History  . Marital status: Married    Spouse name: Not on file  . Number of children: Not on file  . Years of education: Not on file  . Highest education level: Not on file  Occupational History  . Not on file  Social Needs  . Financial resource strain: Not on file  . Food insecurity:    Worry: Not on file    Inability: Not on file  . Transportation needs:    Medical: Not on file    Non-medical: Not on file  Tobacco Use  . Smoking status: Never Smoker  . Smokeless tobacco: Never Used  Substance and Sexual Activity  . Alcohol use: No    Alcohol/week: 0.0 oz     Frequency: Never  . Drug use: No  . Sexual activity: Not Currently  Lifestyle  . Physical activity:    Days per week: Not on file    Minutes per session: Not on file  . Stress: Not on file  Relationships  . Social connections:    Talks on phone: Not on file    Gets together: Not on file    Attends religious service: Not on file    Active member of club or organization: Not on file    Attends meetings of clubs or organizations: Not on file    Relationship status: Not on file  . Intimate partner violence:    Fear of current or ex partner: Not on file    Emotionally abused: Not on file    Physically abused: Not on file    Forced sexual activity: Not on file  Other Topics Concern  . Not on file  Social History Narrative  . Not on file     Vitals:   08/09/17 0819  BP: 134/86  Pulse: (!) 128  SpO2: 98%  Weight: 223 lb (101.2 kg)  Height: 6\' 1"  (1.854 m)    Wt Readings from Last 3 Encounters:  08/09/17 223 lb (101.2 kg)  06/05/17 221 lb (100.2 kg)  04/12/17 220 lb (99.8 kg)     PHYSICAL EXAM General: NAD HEENT: Normal. Neck: No JVD, no thyromegaly. Lungs: Clear to auscultation bilaterally with normal respiratory effort. CV: Tachycardic, regular rhythm, normal S1/S2, no S3/S4, no murmur. No pretibial or periankle edema.    Abdomen: Soft, nontender, no distention.  Neurologic: Alert and oriented.  Psych: Normal affect. Skin: Normal. Musculoskeletal: No gross deformities.    ECG: Reviewed above under Subjective   Labs: Lab Results  Component Value Date/Time   K 3.9 02/16/2017 11:25 AM   BUN 12 02/16/2017 11:25 AM   CREATININE 1.00 02/16/2017 11:25 AM   HGB 15.1 02/16/2017 11:25 AM     Lipids: No results found for: LDLCALC, LDLDIRECT, CHOL, TRIG, HDL     ASSESSMENT AND PLAN: 1.  Persistent atrial fibrillation and flutter status post ablation with rapid atrial flutter: Anticoagulated with Eliquis but he did miss a dose about a week and a half ago.   Currently on Toprol-XL 50 mg daily.  I will increase Toprol-XL to 50 mg twice daily.  I will arrange for transesophageal echocardiogram and direct-current cardioversion this week at [facility].  I emphasized the importance of not missing doses of Eliquis in particular.  2.  Hypertension: Blood pressure is presently normal.  I will monitor given increase of Toprol-XL.    Disposition: Follow  up in the near future with Dr. Allred/atrial fibrillation clinic.  A high level of decision making was required for increased medical complexities.    Kate Sable, M.D., F.A.C.C.

## 2017-08-09 NOTE — Patient Instructions (Addendum)
Your physician recommends that you schedule a follow-up appointment in: [place]  Your physician has recommended you make the following change in your medication:   TAKE TOPROL XL 50 MG TWICE DAILY  Your physician recommends that you have for lab work PRIOR TO [place] BMP/CBC  Your physician has requested that you have a TEE/Cardioversion. During a TEE, sound waves are used to create images of your heart. It provides your doctor with information about the size and shape of your heart and how well your heart's chambers and valves are working. In this test, a transducer is attached to the end of a flexible tube that is guided down you throat and into your esophagus (the tube leading from your mouth to your stomach) to get a more detailed image of your heart. Once the TEE has determined that a blood clot is not present, the cardioversion begins. Electrical Cardioversion uses a jolt of electricity to your heart either through paddles or wired patches attached to your chest. This is a controlled, usually prescheduled, procedure. This procedure is done at the hospital and you are not awake during the procedure. You usually go home the day of the procedure. Please see the instruction sheet given to you today for more information.  Thank you for choosing [facility]!!

## 2017-08-09 NOTE — Progress Notes (Signed)
SUBJECTIVE: The patient presents for evaluation of atrial fibrillation.  He called our office on 8/5 stating that he continued to feel unwell and thought he went into atrial fibrillation.  He said his heart rates were higher than 100 bpm. He underwent atrial fibrillation ablation.  On 06/05/2017 when he saw Dr. Rayann Heman in the office, he was instructed to take flecainide 75 mg twice daily for 4 weeks and then stop it and reduce Toprol-XL to 50 mg daily.  ECG performed in the office today which I ordered and personally interpreted demonstrates what appears to be rapid atrial flutter, 131 bpm.  He started feeling unwell on 8/3 and noticed heart rates were climbing to the 80s to low 100 bpm range.  Over the course of the next few days his heart rate gradually increased up to the 120-130 range.  He denies any lightheadedness, dizziness, and syncope.  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  It appears he missed a dose of Eliquis on the Saturday before last.   Review of Systems: As per "subjective", otherwise negative.  No Known Allergies  Current Outpatient Medications  Medication Sig Dispense Refill  . amLODipine (NORVASC) 10 MG tablet TAKE 1 TABLET DAILY 90 tablet 0  . apixaban (ELIQUIS) 5 MG TABS tablet Take 1 tablet (5 mg total) by mouth 2 (two) times daily. 180 tablet 3  . atorvastatin (LIPITOR) 10 MG tablet Take 5 mg by mouth every Monday, Wednesday, and Friday.     . cetirizine (ZYRTEC) 10 MG tablet Take 10 mg by mouth daily as needed for allergies.    . Chlorpheniramine-DM (CORICIDIN HBP COUGH/COLD PO) Take 1 tablet by mouth 2 (two) times daily as needed (cold symptoms).    . cyclobenzaprine (FLEXERIL) 10 MG tablet Take 10 mg by mouth daily as needed for muscle spasms.    Marland Kitchen gabapentin (NEURONTIN) 100 MG capsule Take 300 mg by mouth at bedtime.     Marland Kitchen lisinopril-hydrochlorothiazide (PRINZIDE,ZESTORETIC) 20-12.5 MG per tablet Take 2  tablets by mouth daily.     . Melatonin 3 MG TABS Take 3 mg by mouth at bedtime.    . metFORMIN (GLUCOPHAGE-XR) 500 MG 24 hr tablet Take 1,000 mg by mouth 2 (two) times daily.   1  . metoprolol succinate (TOPROL-XL) 50 MG 24 hr tablet Take 1 tablet (50 mg total) by mouth daily. Take with or immediately following a meal. 90 tablet 3  . ofloxacin (OCUFLOX) 0.3 % ophthalmic solution Place 1 drop into the left eye 4 (four) times daily.  3  . pantoprazole (PROTONIX) 40 MG tablet Take 1 tablet (40 mg total) by mouth daily. 90 tablet 3  . Polyvinyl Alcohol-Povidone (REFRESH OP) Place 1 drop into the right eye 4 (four) times daily.     No current facility-administered medications for this visit.     Past Medical History:  Diagnosis Date  . Atrial fibrillation ([place])   . Dyslipidemia   . Ejection fraction    EF 55-60%, echo, May, 2014  . Heart murmur    As a Child  . Hypertension   . Overweight(278.02)   . Type II diabetes mellitus ([place])     Past Surgical History:  Procedure Laterality Date  . ATRIAL FIBRILLATION ABLATION N/A 03/02/2017   Procedure: ATRIAL FIBRILLATION ABLATION;  Surgeon: Thompson Grayer, MD;  Location: [facility];  Service: Cardiovascular;  Laterality: N/A;  . BACK SURGERY    .  DECOMPRESSIVE LUMBAR LAMINECTOMY LEVEL 2 Right 08/2009   L3-4; L4-5/notes 08/22/2009    Social History   Socioeconomic History  . Marital status: Married    Spouse name: Not on file  . Number of children: Not on file  . Years of education: Not on file  . Highest education level: Not on file  Occupational History  . Not on file  Social Needs  . Financial resource strain: Not on file  . Food insecurity:    Worry: Not on file    Inability: Not on file  . Transportation needs:    Medical: Not on file    Non-medical: Not on file  Tobacco Use  . Smoking status: Never Smoker  . Smokeless tobacco: Never Used  Substance and Sexual Activity  . Alcohol use: No    Alcohol/week: 0.0 oz     Frequency: Never  . Drug use: No  . Sexual activity: Not Currently  Lifestyle  . Physical activity:    Days per week: Not on file    Minutes per session: Not on file  . Stress: Not on file  Relationships  . Social connections:    Talks on phone: Not on file    Gets together: Not on file    Attends religious service: Not on file    Active member of club or organization: Not on file    Attends meetings of clubs or organizations: Not on file    Relationship status: Not on file  . Intimate partner violence:    Fear of current or ex partner: Not on file    Emotionally abused: Not on file    Physically abused: Not on file    Forced sexual activity: Not on file  Other Topics Concern  . Not on file  Social History Narrative  . Not on file     Vitals:   08/09/17 0819  BP: 134/86  Pulse: (!) 128  SpO2: 98%  Weight: 223 lb (101.2 kg)  Height: 6\' 1"  (1.854 m)    Wt Readings from Last 3 Encounters:  08/09/17 223 lb (101.2 kg)  06/05/17 221 lb (100.2 kg)  04/12/17 220 lb (99.8 kg)     PHYSICAL EXAM General: NAD HEENT: Normal. Neck: No JVD, no thyromegaly. Lungs: Clear to auscultation bilaterally with normal respiratory effort. CV: Tachycardic, regular rhythm, normal S1/S2, no S3/S4, no murmur. No pretibial or periankle edema.    Abdomen: Soft, nontender, no distention.  Neurologic: Alert and oriented.  Psych: Normal affect. Skin: Normal. Musculoskeletal: No gross deformities.    ECG: Reviewed above under Subjective   Labs: Lab Results  Component Value Date/Time   K 3.9 02/16/2017 11:25 AM   BUN 12 02/16/2017 11:25 AM   CREATININE 1.00 02/16/2017 11:25 AM   HGB 15.1 02/16/2017 11:25 AM     Lipids: No results found for: LDLCALC, LDLDIRECT, CHOL, TRIG, HDL     ASSESSMENT AND PLAN: 1.  Persistent atrial fibrillation and flutter status post ablation with rapid atrial flutter: Anticoagulated with Eliquis but he did miss a dose about a week and a half ago.   Currently on Toprol-XL 50 mg daily.  I will increase Toprol-XL to 50 mg twice daily.  I will arrange for transesophageal echocardiogram and direct-current cardioversion this week at [facility].  I emphasized the importance of not missing doses of Eliquis in particular.  2.  Hypertension: Blood pressure is presently normal.  I will monitor given increase of Toprol-XL.    Disposition: Follow  up in the near future with Dr. Allred/atrial fibrillation clinic.  A high level of decision making was required for increased medical complexities.    Kate Sable, M.D., F.A.C.C.

## 2017-08-09 NOTE — Telephone Encounter (Signed)
Pre-cert Verification for the following procedure   TEE/CARDIOVERSION 08/11/17 scheduled for [facility]

## 2017-08-10 ENCOUNTER — Encounter (HOSPITAL_COMMUNITY)
Admission: RE | Admit: 2017-08-10 | Discharge: 2017-08-10 | Disposition: A | Discharge status: Home / Self Care | Payer: Medicare Other | Source: Ambulatory Visit | Attending: Internal Medicine | Admitting: Internal Medicine

## 2017-08-10 MED ORDER — SODIUM CHLORIDE 0.9 % IV SOLN: INTRAVENOUS | Status: DC

## 2017-08-11 ENCOUNTER — Encounter (HOSPITAL_COMMUNITY): Payer: Self-pay | Admitting: *Deleted

## 2017-08-11 ENCOUNTER — Encounter (HOSPITAL_COMMUNITY)
Admission: RE | Disposition: A | Discharge status: Home / Self Care | Payer: Self-pay | Source: Ambulatory Visit | Attending: Internal Medicine

## 2017-08-11 ENCOUNTER — Ambulatory Visit (HOSPITAL_BASED_OUTPATIENT_CLINIC_OR_DEPARTMENT_OTHER): Payer: Medicare Other

## 2017-08-11 ENCOUNTER — Ambulatory Visit (HOSPITAL_COMMUNITY): Payer: Medicare Other | Admitting: Anesthesiology

## 2017-08-11 ENCOUNTER — Other Ambulatory Visit: Payer: Self-pay

## 2017-08-11 ENCOUNTER — Ambulatory Visit (HOSPITAL_COMMUNITY)
Admission: RE | Admit: 2017-08-11 | Discharge: 2017-08-11 | Disposition: A | Discharge status: Home / Self Care | Payer: Medicare Other | Source: Ambulatory Visit | Attending: Internal Medicine | Admitting: Internal Medicine

## 2017-08-11 DIAGNOSIS — I700 Atherosclerosis of aorta: Secondary | ICD-10-CM | POA: Diagnosis not present

## 2017-08-11 DIAGNOSIS — Z6829 Body mass index (BMI) 29.0-29.9, adult: Secondary | ICD-10-CM | POA: Diagnosis not present

## 2017-08-11 DIAGNOSIS — I481 Persistent atrial fibrillation: Secondary | ICD-10-CM | POA: Insufficient documentation

## 2017-08-11 DIAGNOSIS — I4891 Unspecified atrial fibrillation: Secondary | ICD-10-CM

## 2017-08-11 DIAGNOSIS — I10 Essential (primary) hypertension: Secondary | ICD-10-CM | POA: Diagnosis not present

## 2017-08-11 DIAGNOSIS — E119 Type 2 diabetes mellitus without complications: Secondary | ICD-10-CM | POA: Diagnosis not present

## 2017-08-11 DIAGNOSIS — Z7984 Long term (current) use of oral hypoglycemic drugs: Secondary | ICD-10-CM | POA: Insufficient documentation

## 2017-08-11 DIAGNOSIS — I4892 Unspecified atrial flutter: Secondary | ICD-10-CM | POA: Diagnosis not present

## 2017-08-11 DIAGNOSIS — K219 Gastro-esophageal reflux disease without esophagitis: Secondary | ICD-10-CM | POA: Diagnosis not present

## 2017-08-11 DIAGNOSIS — Z7901 Long term (current) use of anticoagulants: Secondary | ICD-10-CM | POA: Insufficient documentation

## 2017-08-11 DIAGNOSIS — Z79899 Other long term (current) drug therapy: Secondary | ICD-10-CM | POA: Diagnosis not present

## 2017-08-11 DIAGNOSIS — E785 Hyperlipidemia, unspecified: Secondary | ICD-10-CM | POA: Diagnosis not present

## 2017-08-11 DIAGNOSIS — R000 Tachycardia, unspecified: Secondary | ICD-10-CM | POA: Diagnosis not present

## 2017-08-11 DIAGNOSIS — E669 Obesity, unspecified: Secondary | ICD-10-CM | POA: Diagnosis not present

## 2017-08-11 HISTORY — PX: TEE WITHOUT CARDIOVERSION: SHX5443

## 2017-08-11 HISTORY — PX: CARDIOVERSION: SHX1299

## 2017-08-11 LAB — GLUCOSE, CAPILLARY
GLUCOSE-CAPILLARY: 160 mg/dL — AB (ref 70–99)
Glucose-Capillary: 159 mg/dL — ABNORMAL HIGH (ref 70–99)

## 2017-08-11 SURGERY — ECHOCARDIOGRAM, TRANSESOPHAGEAL
Anesthesia: General

## 2017-08-11 MED ORDER — LIDOCAINE VISCOUS HCL 2 % MT SOLN
OROMUCOSAL | Status: AC
  Filled 2017-08-11: qty 15

## 2017-08-11 MED ORDER — PROPOFOL 10 MG/ML IV BOLUS
INTRAVENOUS | Status: AC
  Filled 2017-08-11: qty 40

## 2017-08-11 MED ORDER — MIDAZOLAM HCL 2 MG/2ML IJ SOLN
INTRAMUSCULAR | Status: AC
  Filled 2017-08-11: qty 2

## 2017-08-11 MED ORDER — PROPOFOL 500 MG/50ML IV EMUL
INTRAVENOUS | Status: DC | PRN
  Administered 2017-08-11: 75 ug/kg/min via INTRAVENOUS

## 2017-08-11 MED ORDER — HYDROCODONE-ACETAMINOPHEN 7.5-325 MG PO TABS: 1.0000 | ORAL_TABLET | Freq: Once | ORAL | Status: DC | PRN

## 2017-08-11 MED ORDER — FENTANYL CITRATE (PF) 100 MCG/2ML IJ SOLN: 25.0000 ug | INTRAMUSCULAR | Status: DC | PRN

## 2017-08-11 MED ORDER — LACTATED RINGERS IV SOLN
INTRAVENOUS | Status: DC
  Administered 2017-08-11: 09:00:00 via INTRAVENOUS

## 2017-08-11 MED ORDER — BUTAMBEN-TETRACAINE-BENZOCAINE 2-2-14 % EX AERO
INHALATION_SPRAY | CUTANEOUS | Status: AC
  Filled 2017-08-11: qty 5

## 2017-08-11 MED ORDER — MIDAZOLAM HCL 5 MG/5ML IJ SOLN
INTRAMUSCULAR | Status: DC | PRN
  Administered 2017-08-11: 2 mg via INTRAVENOUS

## 2017-08-11 NOTE — Interval H&P Note (Signed)
History and Physical Interval Note:  08/11/2017 10:47 AM  [name]  has presented today for surgery, with the diagnosis of a-fib, a-flutter  The various methods of treatment have been discussed with the patient and family. After consideration of risks, benefits and other options for treatment, the patient has consented to  Procedure(s): TRANSESOPHAGEAL ECHOCARDIOGRAM (TEE) WITH PROPOFOL (N/A) CARDIOVERSION (N/A) as a surgical intervention .  The patient's history has been reviewed, patient examined, no change in status, stable for surgery.  I have reviewed the patient's chart and labs.  Questions were answered to the patient's satisfaction.     Nathan Sutton

## 2017-08-11 NOTE — Progress Notes (Signed)
*  PRELIMINARY RESULTS* Echocardiogram Echocardiogram Transesophageal has been performed.  Nathan Sutton 08/11/2017, 12:03 PM

## 2017-08-11 NOTE — Transfer of Care (Signed)
Immediate Anesthesia Transfer of Care Note  Patient: Nathan Sutton  Procedure(s) Performed: TRANSESOPHAGEAL ECHOCARDIOGRAM (TEE) WITH PROPOFOL (N/A ) CARDIOVERSION (N/A )  Patient Location: PACU  Anesthesia Type:MAC  Level of Consciousness: drowsy and patient cooperative  Airway & Oxygen Therapy: Patient Spontanous Breathing and Patient connected to face mask oxygen  Post-op Assessment: Report given to RN and Post -op Vital signs reviewed and stable  Post vital signs: Reviewed and stable  Last Vitals:  Vitals Value Taken Time  BP    Temp    Pulse    Resp    SpO2      Last Pain:  Vitals:   08/11/17 0749  TempSrc: Oral  PainSc: 0-No pain      Patients Stated Pain Goal: 5 (87/27/61 8485)  Complications: No apparent anesthesia complications

## 2017-08-11 NOTE — Anesthesia Preprocedure Evaluation (Signed)
Anesthesia Evaluation  Patient identified by MRN, date of birth, ID band Patient awake    Reviewed: Allergy & Precautions, NPO status , Patient's Chart, lab work & pertinent test results, reviewed documented beta blocker date and time   Airway Mallampati: II  TM Distance: >3 FB Neck ROM: Full    Dental no notable dental hx. (+) Teeth Intact   Pulmonary neg pulmonary ROS,    Pulmonary exam normal breath sounds clear to auscultation       Cardiovascular Exercise Tolerance: Good hypertension, Pt. on medications negative cardio ROS Normal cardiovascular exam+ dysrhythmias Atrial Fibrillation I Rhythm:Irregular Rate:Tachycardia  H/o Ablation in 02/2017  Was doing well until recently   Neuro/Psych negative neurological ROS  negative psych ROS   GI/Hepatic negative GI ROS, Neg liver ROS, GERD  Medicated and Controlled,Denies GERD Sx today   Endo/Other  negative endocrine ROSdiabetes, Type 2, Oral Hypoglycemic Agents  Renal/GU negative Renal ROS  negative genitourinary   Musculoskeletal negative musculoskeletal ROS (+)   Abdominal   Peds negative pediatric ROS (+)  Hematology negative hematology ROS (+)   Anesthesia Other Findings   Reproductive/Obstetrics negative OB ROS                             Anesthesia Physical Anesthesia Plan  ASA: III  Anesthesia Plan: General   Post-op Pain Management:    Induction: Intravenous  PONV Risk Score and Plan:   Airway Management Planned: Simple Face Mask  Additional Equipment:   Intra-op Plan:   Post-operative Plan:   Informed Consent: I have reviewed the patients History and Physical, chart, labs and discussed the procedure including the risks, benefits and alternatives for the proposed anesthesia with the patient or authorized representative who has indicated his/her understanding and acceptance.   Dental advisory given  Plan Discussed  with: CRNA  Anesthesia Plan Comments:         Anesthesia Quick Evaluation

## 2017-08-11 NOTE — Op Note (Signed)
Cardioversion  Pt sedated with propofol      With pads in AP position patient cardioverted to SR with 200 J synchronized biphasic energy Procedure without complication 12 lead EKG pending  Nathan Sutton

## 2017-08-11 NOTE — Progress Notes (Signed)
Electrical Cardioversion Procedure Note NOELLE HOOGLAND [id number] [birth year]  Procedure: Electrical Cardioversion Indications:  Persistent Atrial Flutter  Procedure Details Consent: yes Time Out: Verified patient identification, verified procedure, site/side was marked, verified correct patient position, special equipment/implants available, medications/allergies/relevent history reviewed, required imaging and test results available.  Time Out: 1024 Patient placed on cardiac monitor, pulse oximetry, supplemental oxygen as necessary.  Sedation given: Yes Pacer pads placed Yes  Cardioverted 1 time(s).  Cardioverted at 200 joules Evaluation Findings: Post procedure EKG shows: Normal Sinus Rhythm Complications: None Patient did tolerate procedure well.   Hillery Jacks 08/11/2017, 11:16 AM

## 2017-08-11 NOTE — Op Note (Signed)
TEE Pt anesthetized by anesthesia with propofol TEE probe advanced to mid esophagus without problem   LA appendage  with prominent pectinate  No thrombus LA , RA with no masses\ LVEF normal   RVEF normal TV normal  Triv TR PV normal MV mildly thickend   Trace MR AV is thickened, mildly calcified   Does not appear stenotic   No AI  Miinimal plaqung of the thoracic aorta   Full report to follow

## 2017-08-11 NOTE — Anesthesia Postprocedure Evaluation (Signed)
Anesthesia Post Note  Patient: BRYLAN DEC  Procedure(s) Performed: TRANSESOPHAGEAL ECHOCARDIOGRAM (TEE) WITH PROPOFOL (N/A ) CARDIOVERSION (N/A )  Patient location during evaluation: PACU Anesthesia Type: MAC Level of consciousness: awake and alert and patient cooperative Pain management: pain level controlled Vital Signs Assessment: post-procedure vital signs reviewed and stable Respiratory status: spontaneous breathing, nonlabored ventilation and respiratory function stable Cardiovascular status: blood pressure returned to baseline Postop Assessment: no apparent nausea or vomiting Anesthetic complications: no     Last Vitals:  Vitals:   08/11/17 0749 08/11/17 0850  BP: 132/90 125/86  Pulse: (!) 133 (!) 131  Resp: 20 (!) 22  Temp: 37 C   SpO2: 97%     Last Pain:  Vitals:   08/11/17 0749  TempSrc: Oral  PainSc: 0-No pain                 Rourke Mcquitty J

## 2017-08-11 NOTE — Discharge Instructions (Signed)
PATIENT INSTRUCTIONS POST-ANESTHESIA  IMMEDIATELY FOLLOWING SURGERY:  Do not drive or operate machinery for the first twenty four hours after surgery.  Do not make any important decisions for twenty four hours after surgery or while taking narcotic pain medications or sedatives.  If you develop intractable nausea and vomiting or a severe headache please notify your doctor immediately.  FOLLOW-UP:  Please make an appointment with your surgeon as instructed. You do not need to follow up with anesthesia unless specifically instructed to do so.  WOUND CARE INSTRUCTIONS (if applicable):  Keep a dry clean dressing on the anesthesia/puncture wound site if there is drainage.  Once the wound has quit draining you may leave it open to air.  Generally you should leave the bandage intact for twenty four hours unless there is drainage.  If the epidural site drains for more than 36-48 hours please call the anesthesia department.  QUESTIONS?:  Please feel free to call your physician or the hospital operator if you have any questions, and they will be happy to assist you.        Electrical Cardioversion Electrical cardioversion is the delivery of a jolt of electricity to restore a normal rhythm to the heart. A rhythm that is too fast or is not regular keeps the heart from pumping well. In this procedure, sticky patches or metal paddles are placed on the chest to deliver electricity to the heart from a device. This procedure may be done in an emergency if:  There is low or no blood pressure as a result of the heart rhythm.  Normal rhythm must be restored as fast as possible to protect the brain and heart from further damage.  It may save a life.  This procedure may also be done for irregular or fast heart rhythms that are not immediately life-threatening. Tell a health care provider about:  Any allergies you have.  All medicines you are taking, including vitamins, herbs, eye drops, creams, and  over-the-counter medicines.  Any problems you or family members have had with anesthetic medicines.  Any blood disorders you have.  Any surgeries you have had.  Any medical conditions you have.  Whether you are pregnant or may be pregnant. What are the risks? Generally, this is a safe procedure. However, problems may occur, including:  Allergic reactions to medicines.  A blood clot that breaks free and travels to other parts of your body.  The possible return of an abnormal heart rhythm within hours or days after the procedure.  Your heart stopping (cardiac arrest). This is rare.  What happens before the procedure? Medicines  Your health care provider may have you start taking: ? Blood-thinning medicines (anticoagulants) so your blood does not clot as easily. ? Medicines may be given to help stabilize your heart rate and rhythm.  Ask your health care provider about changing or stopping your regular medicines. This is especially important if you are taking diabetes medicines or blood thinners. General instructions  Plan to have someone take you home from the hospital or clinic.  If you will be going home right after the procedure, plan to have someone with you for 24 hours.  Follow instructions from your health care provider about eating or drinking restrictions. What happens during the procedure?  To lower your risk of infection: ? Your health care team will wash or sanitize their hands. ? Your skin will be washed with soap.  An IV tube will be inserted into one of your veins.  You  will be given a medicine to help you relax (sedative).  Sticky patches (electrodes) or metal paddles may be placed on your chest.  An electrical shock will be delivered. The procedure may vary among health care providers and hospitals. What happens after the procedure?  Your blood pressure, heart rate, breathing rate, and blood oxygen level will be monitored until the medicines you  were given have worn off.  Do not drive for 24 hours if you were given a sedative.  Your heart rhythm will be watched to make sure it does not change. This information is not intended to replace advice given to you by your health care provider. Make sure you discuss any questions you have with your health care provider. Document Released: 12/10/2001 Document Revised: 08/19/2015 Document Reviewed: 06/26/2015 Elsevier Interactive Patient Education  2017 [employer] PATIENT INSTRUCTIONS POST-ANESTHESIA  IMMEDIATELY FOLLOWING SURGERY:  Do not drive or operate machinery for the first twenty four hours after surgery.  Do not make any important decisions for twenty four hours after surgery or while taking narcotic pain medications or sedatives.  If you develop intractable nausea and vomiting or a severe headache please notify your doctor immediately.  FOLLOW-UP:  Please make an appointment with your surgeon as instructed. You do not need to follow up with anesthesia unless specifically instructed to do so.  WOUND CARE INSTRUCTIONS (if applicable):  Keep a dry clean dressing on the anesthesia/puncture wound site if there is drainage.  Once the wound has quit draining you may leave it open to air.  Generally you should leave the bandage intact for twenty four hours unless there is drainage.  If the epidural site drains for more than 36-48 hours please call the anesthesia department.  QUESTIONS?:  Please feel free to call your physician or the hospital operator if you have any questions, and they will be happy to assist you.

## 2017-08-14 ENCOUNTER — Telehealth (HOSPITAL_COMMUNITY): Payer: Self-pay | Admitting: *Deleted

## 2017-08-14 MED ORDER — FLECAINIDE ACETATE 150 MG PO TABS
75.0000 mg | ORAL_TABLET | Freq: Two times a day (BID) | ORAL | Status: DC
Start: 2017-08-14 — End: 2017-08-17

## 2017-08-14 NOTE — Telephone Encounter (Signed)
Patient called in stating he had a cardioversion on Friday but is already back out of rhythm as of yesterday. His HR is anywhere between 70-150. He is taking metoprolol 50mg  BID. Discussed with Roderic Palau NP -- will restart flecainide at 75mg  tonight and in the AM then see tomorrow afternoon. Pt in agreement.

## 2017-08-15 ENCOUNTER — Encounter (HOSPITAL_COMMUNITY): Payer: Self-pay | Admitting: Nurse Practitioner

## 2017-08-15 ENCOUNTER — Ambulatory Visit (HOSPITAL_COMMUNITY)
Admission: RE | Admit: 2017-08-15 | Discharge: 2017-08-15 | Disposition: A | Discharge status: Home / Self Care | Payer: Medicare Other | Source: Ambulatory Visit | Attending: Nurse Practitioner | Admitting: Nurse Practitioner

## 2017-08-15 VITALS — BP 126/72 | HR 76 | Ht 73.0 in | Wt 222.0 lb

## 2017-08-15 DIAGNOSIS — I10 Essential (primary) hypertension: Secondary | ICD-10-CM | POA: Insufficient documentation

## 2017-08-15 DIAGNOSIS — Z7901 Long term (current) use of anticoagulants: Secondary | ICD-10-CM | POA: Diagnosis not present

## 2017-08-15 DIAGNOSIS — E785 Hyperlipidemia, unspecified: Secondary | ICD-10-CM | POA: Insufficient documentation

## 2017-08-15 DIAGNOSIS — E119 Type 2 diabetes mellitus without complications: Secondary | ICD-10-CM | POA: Diagnosis not present

## 2017-08-15 DIAGNOSIS — Z7984 Long term (current) use of oral hypoglycemic drugs: Secondary | ICD-10-CM | POA: Diagnosis not present

## 2017-08-15 DIAGNOSIS — I4892 Unspecified atrial flutter: Secondary | ICD-10-CM | POA: Insufficient documentation

## 2017-08-15 DIAGNOSIS — Z9889 Other specified postprocedural states: Secondary | ICD-10-CM | POA: Insufficient documentation

## 2017-08-15 DIAGNOSIS — Z79899 Other long term (current) drug therapy: Secondary | ICD-10-CM | POA: Diagnosis not present

## 2017-08-15 DIAGNOSIS — I480 Paroxysmal atrial fibrillation: Secondary | ICD-10-CM | POA: Diagnosis not present

## 2017-08-15 DIAGNOSIS — Z8249 Family history of ischemic heart disease and other diseases of the circulatory system: Secondary | ICD-10-CM | POA: Insufficient documentation

## 2017-08-15 NOTE — Progress Notes (Signed)
Primary Care Physician: Manon Hilding, MD EP: Dr. Rayann Heman Cardiologist: Dr. Yaakov Guthrie Nathan Sutton is a 67 y.o. male with a h/o afib,s/p ablation, 03/02/17. He saw Dr. Rayann Heman in June and was weaned off his flecainide and decreased Toprol to 50 mg qd..He developed atrial flutter 8/7, seen by Dr. Bronson Ing with return of Toprol to 50 mg bid and was set up or cardioversion 8/9, which was successful but returned to afib within a few days. He called the afib office yesterday and asked for advice.  I advised to restart 75 mg flecainide bid. He went back into rhythm 2 hours after taking the flecainide. Today, he remains in SR and feels improved.  Today, he denies symptoms of palpitations, chest pain, shortness of breath, orthopnea, PND, lower extremity edema, dizziness, presyncope, syncope, or neurologic sequela. The patient is tolerating medications without difficulties and is otherwise without complaint today.   Past Medical History:  Diagnosis Date  . Atrial fibrillation ([place])   . Dyslipidemia   . Ejection fraction    EF 55-60%, echo, May, 2014  . Heart murmur    As a Child  . Hypertension   . Overweight(278.02)   . Type II diabetes mellitus ([place])    Past Surgical History:  Procedure Laterality Date  . ATRIAL FIBRILLATION ABLATION N/A 03/02/2017   Procedure: ATRIAL FIBRILLATION ABLATION;  Surgeon: Thompson Grayer, MD;  Location: [facility];  Service: Cardiovascular;  Laterality: N/A;  . BACK SURGERY    . CARDIOVERSION N/A 08/11/2017   Procedure: CARDIOVERSION;  Surgeon: Fay Records, MD;  Location: [facility];  Service: Cardiovascular;  Laterality: N/A;  . DECOMPRESSIVE LUMBAR LAMINECTOMY LEVEL 2 Right 08/2009   L3-4; L4-5/notes 08/22/2009  . TEE WITHOUT CARDIOVERSION N/A 08/11/2017   Procedure: TRANSESOPHAGEAL ECHOCARDIOGRAM (TEE) WITH PROPOFOL;  Surgeon: Fay Records, MD;  Location: [facility];  Service: Cardiovascular;  Laterality: N/A;    Current Outpatient Medications  Medication  Sig Dispense Refill  . acetaminophen (TYLENOL) 500 MG tablet Take 1,000 mg by mouth 3 (three) times daily as needed (for pain.).    Marland Kitchen amLODipine (NORVASC) 10 MG tablet TAKE 1 TABLET DAILY 90 tablet 0  . apixaban (ELIQUIS) 5 MG TABS tablet Take 1 tablet (5 mg total) by mouth 2 (two) times daily. 180 tablet 3  . atorvastatin (LIPITOR) 10 MG tablet Take 5 mg by mouth every Monday, Wednesday, and Friday.     . cetirizine (ZYRTEC) 10 MG tablet Take 5-10 mg by mouth daily as needed for allergies.     . cyclobenzaprine (FLEXERIL) 10 MG tablet Take 10 mg by mouth daily as needed for muscle spasms.    . flecainide (TAMBOCOR) 150 MG tablet Take 0.5 tablets (75 mg total) by mouth 2 (two) times daily.    Marland Kitchen gabapentin (NEURONTIN) 300 MG capsule Take 300 mg by mouth at bedtime.    Marland Kitchen lisinopril-hydrochlorothiazide (PRINZIDE,ZESTORETIC) 20-12.5 MG per tablet Take 2 tablets by mouth daily.     . metFORMIN (GLUCOPHAGE-XR) 500 MG 24 hr tablet Take 1,000 mg by mouth 2 (two) times daily.   1  . metoprolol succinate (TOPROL-XL) 50 MG 24 hr tablet Take 1 tablet (50 mg total) by mouth 2 (two) times daily. 60 tablet 3  . pantoprazole (PROTONIX) 40 MG tablet Take 1 tablet (40 mg total) by mouth daily. 90 tablet 3  . Polyethyl Glycol-Propyl Glycol (LUBRICANT EYE DROPS) 0.4-0.3 % SOLN Place 1 drop into both eyes 4 (four) times daily as  needed (for dry eyes).     . ranitidine (ZANTAC) 150 MG tablet Take 150 mg by mouth 2 (two) times daily.    . Melatonin 5 MG TABS Take 5 mg by mouth at bedtime.     No current facility-administered medications for this encounter.     No Known Allergies  Social History   Socioeconomic History  . Marital status: Married    Spouse name: Not on file  . Number of children: Not on file  . Years of education: Not on file  . Highest education level: Not on file  Occupational History  . Not on file  Social Needs  . Financial resource strain: Not on file  . Food insecurity:    Worry: Not  on file    Inability: Not on file  . Transportation needs:    Medical: Not on file    Non-medical: Not on file  Tobacco Use  . Smoking status: Never Smoker  . Smokeless tobacco: Never Used  Substance and Sexual Activity  . Alcohol use: No    Alcohol/week: 0.0 standard drinks    Frequency: Never  . Drug use: No  . Sexual activity: Not Currently  Lifestyle  . Physical activity:    Days per week: Not on file    Minutes per session: Not on file  . Stress: Not on file  Relationships  . Social connections:    Talks on phone: Patient refused    Gets together: Patient refused    Attends religious service: Patient refused    Active member of club or organization: Patient refused    Attends meetings of clubs or organizations: Patient refused    Relationship status: Patient refused  . Intimate partner violence:    Fear of current or ex partner: Patient refused    Emotionally abused: Patient refused    Physically abused: Patient refused    Forced sexual activity: Patient refused  Other Topics Concern  . Not on file  Social History Narrative  . Not on file    Family History  Problem Relation Age of Onset  . Heart failure Mother   . Stroke Father     ROS- All systems are reviewed and negative except as per the HPI above  Physical Exam: Vitals:   08/15/17 1400  BP: 126/72  Pulse: 76  Weight: 100.7 kg  Height: 6\' 1"  (1.854 m)   Wt Readings from Last 3 Encounters:  08/15/17 100.7 kg  08/11/17 101.2 kg  08/09/17 101.2 kg    Labs: Lab Results  Component Value Date   NA 139 08/09/2017   K 4.3 08/09/2017   CL 99 08/09/2017   CO2 30 08/09/2017   GLUCOSE 184 (H) 08/09/2017   BUN 13 08/09/2017   CREATININE 1.14 08/09/2017   CALCIUM 10.5 (H) 08/09/2017   Lab Results  Component Value Date   INR 0.91 08/17/2009   No results found for: CHOL, HDL, LDLCALC, TRIG   GEN- The patient is well appearing, alert and oriented x 3 today.   Head- normocephalic,  atraumatic Eyes-  Sclera clear, conjunctiva pink Ears- hearing intact Oropharynx- clear Neck- supple, no JVP Lymph- no cervical lymphadenopathy Lungs- Clear to ausculation bilaterally, normal work of breathing Heart- Regular rate and rhythm, no murmurs, rubs or gallops, PMI not laterally displaced GI- soft, NT, ND, + BS Extremities- no clubbing, cyanosis, or edema MS- no significant deformity or atrophy Skin- no rash or lesion Psych- euthymic mood, full affect Neuro- strength and sensation are  intact  EKG-SR at 76 bpm, PR int 174 ms, qrs int 86 ms, qtc 443 ms Epic records reviewed    Assessment and Plan: 1. Paroxysmal afib S/p ablation 02/2017  Had return of atrial flutter in early Auigust Successful cardioversion but ERAF after a few days Has resumed SR with  flecainide 75 mg bid and toprol back to 50 mg bid, continue these drugs at current doses Continue eliquis 5 mg bid for chadsvasc score of 3, reminded not to miss doses   2. HTN Stable  F/u with Dr. Rayann Heman 9/11  Geroge Baseman. Vianna Venezia, [facility] [address] [place], [place] [address] [phone]

## 2017-08-17 ENCOUNTER — Telehealth (HOSPITAL_COMMUNITY): Payer: Self-pay | Admitting: *Deleted

## 2017-08-17 MED ORDER — FLECAINIDE ACETATE 150 MG PO TABS: 150.0000 mg | ORAL_TABLET | Freq: Two times a day (BID) | ORAL | Status: DC

## 2017-08-17 NOTE — Telephone Encounter (Signed)
Patient called in stating he returned to AF today HR in the 110-120 range. His BP is running in the 90-100/60 range. He feels ok but can feel the palpitations. Discussed with Roderic Palau NP -- will increase flecainide to 150mg  twice a day and follow up on Monday for EKG. Reviewed ER precautions. Pt verbalized understanding.

## 2017-08-18 ENCOUNTER — Telehealth (HOSPITAL_COMMUNITY): Payer: Self-pay | Admitting: *Deleted

## 2017-08-18 NOTE — CV Procedure (Signed)
DC Cardioversion  Pt anesthetized by anesthesia  With pads in AP apex/base position patient cardioverted to SR with 200 J synchronized biphasic energy  Procedure was without complication   EKG pending .

## 2017-08-18 NOTE — Telephone Encounter (Signed)
Pt called in stating having issues with low SBP 80-90s with HR in the 120 range. Feels ok. Per Roderic Palau NP- hold amlodipine while in AF so that he can tolerate the metoprolol. ER precautions reviewed. EKG visit on Monday. Pt verbalized understanding.

## 2017-08-21 ENCOUNTER — Ambulatory Visit (HOSPITAL_COMMUNITY)
Admission: RE | Admit: 2017-08-21 | Discharge: 2017-08-21 | Disposition: A | Discharge status: Home / Self Care | Payer: Medicare Other | Source: Ambulatory Visit | Attending: Nurse Practitioner | Admitting: Nurse Practitioner

## 2017-08-21 ENCOUNTER — Other Ambulatory Visit (HOSPITAL_COMMUNITY): Payer: Self-pay | Admitting: Nurse Practitioner

## 2017-08-21 VITALS — Wt 218.2 lb

## 2017-08-21 DIAGNOSIS — I480 Paroxysmal atrial fibrillation: Secondary | ICD-10-CM

## 2017-08-21 LAB — CBC WITH DIFFERENTIAL/PLATELET
Abs Immature Granulocytes: 0.1 10*3/uL (ref 0.0–0.1)
Basophils Absolute: 0.1 10*3/uL (ref 0.0–0.1)
Basophils Relative: 1 %
EOS ABS: 0.4 10*3/uL (ref 0.0–0.7)
EOS PCT: 6 %
HEMATOCRIT: 48.6 % (ref 39.0–52.0)
HEMOGLOBIN: 16.1 g/dL (ref 13.0–17.0)
IMMATURE GRANULOCYTES: 1 %
LYMPHS ABS: 1.6 10*3/uL (ref 0.7–4.0)
Lymphocytes Relative: 21 %
MCH: 30.1 pg (ref 26.0–34.0)
MCHC: 33.1 g/dL (ref 30.0–36.0)
MCV: 90.8 fL (ref 78.0–100.0)
MONOS PCT: 10 %
Monocytes Absolute: 0.7 10*3/uL (ref 0.1–1.0)
Neutro Abs: 4.5 10*3/uL (ref 1.7–7.7)
Neutrophils Relative %: 61 %
Platelets: 302 10*3/uL (ref 150–400)
RBC: 5.35 MIL/uL (ref 4.22–5.81)
RDW: 12.7 % (ref 11.5–15.5)
WBC: 7.4 10*3/uL (ref 4.0–10.5)

## 2017-08-21 LAB — BASIC METABOLIC PANEL
Anion gap: 11 (ref 5–15)
BUN: 14 mg/dL (ref 8–23)
CHLORIDE: 100 mmol/L (ref 98–111)
CO2: 29 mmol/L (ref 22–32)
CREATININE: 1.09 mg/dL (ref 0.61–1.24)
Calcium: 10.2 mg/dL (ref 8.9–10.3)
GFR calc Af Amer: >60 mL/min (ref >60)
GFR calc non Af Amer: >60 mL/min (ref >60)
Glucose, Bld: 169 mg/dL — ABNORMAL HIGH (ref 70–99)
Potassium: 4.7 mmol/L (ref 3.5–5.1)
Sodium: 140 mmol/L (ref 135–145)

## 2017-08-21 NOTE — Patient Instructions (Signed)
Your cardioversion is scheduled for : Monday August 28, 2017 at 11 a.m. Arrive at the [facility] and go to admitting at 9:30 a.m. Do Not eat or drink anything after midnight the night prior to your procedure. Take all your medications with a sip of water prior to arrival. Do NOT miss any doses of your blood thinner. You will NOT be able to drive home after your procedure.

## 2017-08-21 NOTE — Progress Notes (Addendum)
Pt in for repeat EKG  After increase flecainide to be  Reviewed by Ceasar Lund.   Flecainide has been increased back to pt's previous dose of 150 mg bid. Metoprolol is at 50 mg bid. Despite this, it appears that he is still in atrial flutter at 89 bpm. He has not missed any doses of xarelto and just had TEE cardioversion on 8/09. Ablation was done 02/2017. TEE negative for thrombus on last cardioversion 8/9.  I will set back up for cardioversion now that he is back on flecainide. He has f/u with Dr. Rayann Heman 9/11.

## 2017-08-22 ENCOUNTER — Other Ambulatory Visit (HOSPITAL_COMMUNITY): Payer: Self-pay | Admitting: Nurse Practitioner

## 2017-08-28 ENCOUNTER — Encounter (HOSPITAL_COMMUNITY): Admission: RE | Payer: Self-pay | Source: Ambulatory Visit

## 2017-08-28 ENCOUNTER — Ambulatory Visit (HOSPITAL_COMMUNITY)
Admission: RE | Admit: 2017-08-28 | Discharge: 2017-08-28 | Disposition: A | Discharge status: Home / Self Care | Payer: Medicare Other | Source: Ambulatory Visit | Attending: Nurse Practitioner | Admitting: Nurse Practitioner

## 2017-08-28 ENCOUNTER — Ambulatory Visit (HOSPITAL_COMMUNITY): Admission: RE | Admit: 2017-08-28 | Payer: Medicare Other | Source: Ambulatory Visit | Admitting: Cardiology

## 2017-08-28 DIAGNOSIS — I4891 Unspecified atrial fibrillation: Secondary | ICD-10-CM | POA: Insufficient documentation

## 2017-08-28 SURGERY — CARDIOVERSION
Anesthesia: General

## 2017-08-28 NOTE — Progress Notes (Addendum)
Pre dccv EKG. To be reviewed by Roderic Palau, NP  Pt in for ekg after resuming flecainide 150 mg bid. He is in SR today. DCCV cancelled. F/u with Dr. Rayann Heman in 2 weeks as scheduled.

## 2017-08-28 NOTE — Progress Notes (Signed)
Received call from A.fib clinic that patient went to their office prior to procedure for labs and that the patient was in NSR. Will cancel the procedure.

## 2017-09-11 ENCOUNTER — Ambulatory Visit: Payer: Medicare Other | Admitting: Internal Medicine

## 2017-09-13 ENCOUNTER — Ambulatory Visit (INDEPENDENT_AMBULATORY_CARE_PROVIDER_SITE_OTHER): Payer: Medicare Other | Admitting: Internal Medicine

## 2017-09-13 ENCOUNTER — Encounter: Payer: Self-pay | Admitting: Internal Medicine

## 2017-09-13 VITALS — BP 148/90 | HR 57 | Ht 73.0 in | Wt 222.2 lb

## 2017-09-13 DIAGNOSIS — I10 Essential (primary) hypertension: Secondary | ICD-10-CM

## 2017-09-13 DIAGNOSIS — I480 Paroxysmal atrial fibrillation: Secondary | ICD-10-CM | POA: Diagnosis not present

## 2017-09-13 DIAGNOSIS — Z01812 Encounter for preprocedural laboratory examination: Secondary | ICD-10-CM | POA: Diagnosis not present

## 2017-09-13 DIAGNOSIS — I4892 Unspecified atrial flutter: Secondary | ICD-10-CM

## 2017-09-13 NOTE — H&P (View-Only) (Signed)
PCP: Manon Hilding, MD Primary Cardiologist: Dr Bronson Ing Primary EP: Dr Guadalupe Dawn is a 67 y.o. male who presents today for routine electrophysiology followup.  Since last being seen in our clinic, the patient reports doing reasonably well. He developed atypical atrial flutter in August for which he was cardioverted.  This was successful however he subsequently returned to afib.  He has been started on flecainide by AF clinic.  Doing better since then.  Today, he denies symptoms of palpitations, chest pain, shortness of breath,  lower extremity edema, dizziness, presyncope, or syncope.  The patient is otherwise without complaint today.   Past Medical History:  Diagnosis Date  . Atrial fibrillation ([place])   . Dyslipidemia   . Ejection fraction    EF 55-60%, echo, May, 2014  . Heart murmur    As a Child  . Hypertension   . Overweight(278.02)   . Type II diabetes mellitus ([place])    Past Surgical History:  Procedure Laterality Date  . ATRIAL FIBRILLATION ABLATION N/A 03/02/2017   Procedure: ATRIAL FIBRILLATION ABLATION;  Surgeon: Thompson Grayer, MD;  Location: [facility];  Service: Cardiovascular;  Laterality: N/A;  . BACK SURGERY    . CARDIOVERSION N/A 08/11/2017   Procedure: CARDIOVERSION;  Surgeon: Fay Records, MD;  Location: [facility];  Service: Cardiovascular;  Laterality: N/A;  . DECOMPRESSIVE LUMBAR LAMINECTOMY LEVEL 2 Right 08/2009   L3-4; L4-5/notes 08/22/2009  . TEE WITHOUT CARDIOVERSION N/A 08/11/2017   Procedure: TRANSESOPHAGEAL ECHOCARDIOGRAM (TEE) WITH PROPOFOL;  Surgeon: Fay Records, MD;  Location: [facility];  Service: Cardiovascular;  Laterality: N/A;    ROS- all systems are reviewed and negatives except as per HPI above  Current Outpatient Medications  Medication Sig Dispense Refill  . acetaminophen (TYLENOL) 500 MG tablet Take 1,000 mg by mouth 3 (three) times daily as needed (for pain.).    Marland Kitchen amLODipine (NORVASC) 10 MG tablet TAKE 1 TABLET DAILY 90  tablet 0  . apixaban (ELIQUIS) 5 MG TABS tablet Take 1 tablet (5 mg total) by mouth 2 (two) times daily. 180 tablet 3  . atorvastatin (LIPITOR) 10 MG tablet Take 5 mg by mouth every Monday, Wednesday, and Friday.     . cetirizine (ZYRTEC) 10 MG tablet Take 5-10 mg by mouth daily as needed for allergies.     . cyclobenzaprine (FLEXERIL) 10 MG tablet Take 10 mg by mouth daily as needed for muscle spasms.    . flecainide (TAMBOCOR) 150 MG tablet Take 1 tablet (150 mg total) by mouth 2 (two) times daily.    Marland Kitchen gabapentin (NEURONTIN) 300 MG capsule Take 300 mg by mouth at bedtime.    Marland Kitchen lisinopril-hydrochlorothiazide (PRINZIDE,ZESTORETIC) 20-12.5 MG per tablet Take 2 tablets by mouth daily.     . Melatonin 5 MG TABS Take 5 mg by mouth at bedtime.    . metFORMIN (GLUCOPHAGE-XR) 500 MG 24 hr tablet Take 1,000 mg by mouth 2 (two) times daily.   1  . metoprolol succinate (TOPROL-XL) 50 MG 24 hr tablet Take 1 tablet (50 mg total) by mouth 2 (two) times daily. 60 tablet 3  . pantoprazole (PROTONIX) 40 MG tablet Take 1 tablet (40 mg total) by mouth daily. 90 tablet 3  . Polyethyl Glycol-Propyl Glycol (LUBRICANT EYE DROPS) 0.4-0.3 % SOLN Place 1 drop into both eyes 4 (four) times daily as needed (for dry eyes).     . ranitidine (ZANTAC) 150 MG tablet Take 150 mg by mouth 2 (  two) times daily.     No current facility-administered medications for this visit.     Physical Exam: Vitals:   09/13/17 1126  BP: (!) 148/90  Pulse: (!) 57  SpO2: 98%  Weight: 222 lb 3.2 oz (100.8 kg)  Height: 6\' 1"  (1.854 m)    GEN- The patient is well appearing, alert and oriented x 3 today.   Head- normocephalic, atraumatic Eyes-  Sclera clear, conjunctiva pink Ears- hearing intact Oropharynx- clear Lungs- Clear to ausculation bilaterally, normal work of breathing Heart- Regular rate and rhythm, no murmurs, rubs or gallops, PMI not laterally displaced GI- soft, NT, ND, + BS Extremities- no clubbing, cyanosis, or  edema Psych- anxious, many questions, requires frequent redirection  Wt Readings from Last 3 Encounters:  09/13/17 222 lb 3.2 oz (100.8 kg)  08/21/17 218 lb 3.2 oz (99 kg)  08/15/17 222 lb (100.7 kg)    EKG tracing ordered today is personally reviewed and shows sinus rhythm/ PR 230 msec, otherwise normal ekg  Assessment and Plan:  1. Paroxysmal atrial fibrillation/ atrial flutter S/p ablation by me 2/19.  He has had recurrent afib/ atrial flutter since.  Chads2vasc score is 3.  he is anticoagulated with eliquis . Therapeutic strategies for afib including medicine and repeat ablation were discussed in detail with the patient today. Risk, benefits, and alternatives to EP study and radiofrequency ablation for afib were also discussed in detail today. These risks include but are not limited to stroke, bleeding, vascular damage, tamponade, perforation, damage to the esophagus, lungs, and other structures, pulmonary vein stenosis, worsening renal function, and death. The patient understands these risk and wishes to proceed.  We will therefore proceed with catheter ablation at the next available time.  Carto, ICE, anesthesia are requested for the procedure.  Will also obtain cardiac CT prior to the procedure to exclude LAA thrombus and further evaluate atrial anatomy.   2. HTN Stable No change required today  3. Overweight. Body mass index is 29.32 kg/m. Wt Readings from Last 3 Encounters:  09/13/17 222 lb 3.2 oz (100.8 kg)  08/21/17 218 lb 3.2 oz (99 kg)  08/15/17 222 lb (100.7 kg)   Lifestyle modification encouraged   Thompson Grayer MD, [facility] 09/13/2017 11:35 AM

## 2017-09-13 NOTE — Progress Notes (Signed)
PCP: Manon Hilding, MD Primary Cardiologist: Dr Bronson Ing Primary EP: Dr Guadalupe Dawn is a 67 y.o. male who presents today for routine electrophysiology followup.  Since last being seen in our clinic, the patient reports doing reasonably well. He developed atypical atrial flutter in August for which he was cardioverted.  This was successful however he subsequently returned to afib.  He has been started on flecainide by AF clinic.  Doing better since then.  Today, he denies symptoms of palpitations, chest pain, shortness of breath,  lower extremity edema, dizziness, presyncope, or syncope.  The patient is otherwise without complaint today.   Past Medical History:  Diagnosis Date  . Atrial fibrillation ([place])   . Dyslipidemia   . Ejection fraction    EF 55-60%, echo, May, 2014  . Heart murmur    As a Child  . Hypertension   . Overweight(278.02)   . Type II diabetes mellitus ([place])    Past Surgical History:  Procedure Laterality Date  . ATRIAL FIBRILLATION ABLATION N/A 03/02/2017   Procedure: ATRIAL FIBRILLATION ABLATION;  Surgeon: Thompson Grayer, MD;  Location: [facility];  Service: Cardiovascular;  Laterality: N/A;  . BACK SURGERY    . CARDIOVERSION N/A 08/11/2017   Procedure: CARDIOVERSION;  Surgeon: Fay Records, MD;  Location: [facility];  Service: Cardiovascular;  Laterality: N/A;  . DECOMPRESSIVE LUMBAR LAMINECTOMY LEVEL 2 Right 08/2009   L3-4; L4-5/notes 08/22/2009  . TEE WITHOUT CARDIOVERSION N/A 08/11/2017   Procedure: TRANSESOPHAGEAL ECHOCARDIOGRAM (TEE) WITH PROPOFOL;  Surgeon: Fay Records, MD;  Location: [facility];  Service: Cardiovascular;  Laterality: N/A;    ROS- all systems are reviewed and negatives except as per HPI above  Current Outpatient Medications  Medication Sig Dispense Refill  . acetaminophen (TYLENOL) 500 MG tablet Take 1,000 mg by mouth 3 (three) times daily as needed (for pain.).    Marland Kitchen amLODipine (NORVASC) 10 MG tablet TAKE 1 TABLET DAILY 90  tablet 0  . apixaban (ELIQUIS) 5 MG TABS tablet Take 1 tablet (5 mg total) by mouth 2 (two) times daily. 180 tablet 3  . atorvastatin (LIPITOR) 10 MG tablet Take 5 mg by mouth every Monday, Wednesday, and Friday.     . cetirizine (ZYRTEC) 10 MG tablet Take 5-10 mg by mouth daily as needed for allergies.     . cyclobenzaprine (FLEXERIL) 10 MG tablet Take 10 mg by mouth daily as needed for muscle spasms.    . flecainide (TAMBOCOR) 150 MG tablet Take 1 tablet (150 mg total) by mouth 2 (two) times daily.    Marland Kitchen gabapentin (NEURONTIN) 300 MG capsule Take 300 mg by mouth at bedtime.    Marland Kitchen lisinopril-hydrochlorothiazide (PRINZIDE,ZESTORETIC) 20-12.5 MG per tablet Take 2 tablets by mouth daily.     . Melatonin 5 MG TABS Take 5 mg by mouth at bedtime.    . metFORMIN (GLUCOPHAGE-XR) 500 MG 24 hr tablet Take 1,000 mg by mouth 2 (two) times daily.   1  . metoprolol succinate (TOPROL-XL) 50 MG 24 hr tablet Take 1 tablet (50 mg total) by mouth 2 (two) times daily. 60 tablet 3  . pantoprazole (PROTONIX) 40 MG tablet Take 1 tablet (40 mg total) by mouth daily. 90 tablet 3  . Polyethyl Glycol-Propyl Glycol (LUBRICANT EYE DROPS) 0.4-0.3 % SOLN Place 1 drop into both eyes 4 (four) times daily as needed (for dry eyes).     . ranitidine (ZANTAC) 150 MG tablet Take 150 mg by mouth 2 (  two) times daily.     No current facility-administered medications for this visit.     Physical Exam: Vitals:   09/13/17 1126  BP: (!) 148/90  Pulse: (!) 57  SpO2: 98%  Weight: 222 lb 3.2 oz (100.8 kg)  Height: 6\' 1"  (1.854 m)    GEN- The patient is well appearing, alert and oriented x 3 today.   Head- normocephalic, atraumatic Eyes-  Sclera clear, conjunctiva pink Ears- hearing intact Oropharynx- clear Lungs- Clear to ausculation bilaterally, normal work of breathing Heart- Regular rate and rhythm, no murmurs, rubs or gallops, PMI not laterally displaced GI- soft, NT, ND, + BS Extremities- no clubbing, cyanosis, or  edema Psych- anxious, many questions, requires frequent redirection  Wt Readings from Last 3 Encounters:  09/13/17 222 lb 3.2 oz (100.8 kg)  08/21/17 218 lb 3.2 oz (99 kg)  08/15/17 222 lb (100.7 kg)    EKG tracing ordered today is personally reviewed and shows sinus rhythm/ PR 230 msec, otherwise normal ekg  Assessment and Plan:  1. Paroxysmal atrial fibrillation/ atrial flutter S/p ablation by me 2/19.  He has had recurrent afib/ atrial flutter since.  Chads2vasc score is 3.  he is anticoagulated with eliquis . Therapeutic strategies for afib including medicine and repeat ablation were discussed in detail with the patient today. Risk, benefits, and alternatives to EP study and radiofrequency ablation for afib were also discussed in detail today. These risks include but are not limited to stroke, bleeding, vascular damage, tamponade, perforation, damage to the esophagus, lungs, and other structures, pulmonary vein stenosis, worsening renal function, and death. The patient understands these risk and wishes to proceed.  We will therefore proceed with catheter ablation at the next available time.  Carto, ICE, anesthesia are requested for the procedure.  Will also obtain cardiac CT prior to the procedure to exclude LAA thrombus and further evaluate atrial anatomy.   2. HTN Stable No change required today  3. Overweight. Body mass index is 29.32 kg/m. Wt Readings from Last 3 Encounters:  09/13/17 222 lb 3.2 oz (100.8 kg)  08/21/17 218 lb 3.2 oz (99 kg)  08/15/17 222 lb (100.7 kg)   Lifestyle modification encouraged   Thompson Grayer MD, [facility] 09/13/2017 11:35 AM

## 2017-09-13 NOTE — Patient Instructions (Addendum)
Medication Instructions:  Your physician recommends that you continue on your current medications as directed. Please refer to the Current Medication list given to you today.  *If you need a refill on your cardiac medications before your next appointment, please call your pharmacy.  Labwork: You will get lab work today:  [place] notify you of abnormal results, otherwise continue current treatment plan.  Testing/Procedures: Your physician has requested that you have cardiac CT within 7 days prior to your ablation. Cardiac computed tomography (CT) is a painless test that uses an x-ray machine to take clear, detailed pictures of your heart. For further information please visit [email or web address]. Please follow instruction below located under special instructions. You will get a call from our office to schedule the date for this test.  Your physician has recommended that you have an ablation. Catheter ablation is a medical procedure used to treat some cardiac arrhythmias (irregular heartbeats). During catheter ablation, a long, thin, flexible tube is put into a blood vessel in your groin (upper thigh), or neck. This tube is called an ablation catheter. It is then guided to your heart through the blood vessel. Radio frequency waves destroy small areas of heart tissue where abnormal heartbeats may cause an arrhythmia to start. Please see the instructions below located under special instructions  Follow-Up: Your physician recommends that you schedule a follow-up appointment in: 4 weeks, after your procedure on 10/13/2017, with Roderic Palau NP in the AFib clinic.  Your physician recommends that you schedule a follow up appointment in: 3 months, after your procedure on 10/13/2017, with Dr. Rayann Heman.  *Please note that any paperwork needing to be filled out by the provider will need to be addressed at the front desk prior to seeing the provider. Please note that any FMLA, disability or other  documents regarding health condition is subject to a $25.00 charge that must be received prior to completion of paperwork in the form of a money order or check.  Thank you for choosing [facility]!! Trinidad Curet, RN [phone]   Any Other Special Instructions Will Be Listed Below    CARDIAC CT INSTRUCTIONS:  Please arrive at the [facility] main entrance of [facility] at _____ AM (30-45 minutes prior to test start time) [facility] [address] [place],  [id number] [phone]  Proceed to the [facility] Radiology Department (First Floor).  Please follow these instructions carefully (unless otherwise directed):  Hold all erectile dysfunction medications at least 48 hours prior to test.  On the Night Before the Test: . Drink plenty of water. . Do not consume any caffeinated/decaffeinated beverages or chocolate 12 hours prior to your test. . Do not take any antihistamines 12 hours prior to your test. . If you take Metformin do not take 24 hours prior to test.  On the Day of the Test: . Drink plenty of water. Do not drink any water within one hour of the test. . Do not eat any food 4 hours prior to the test. . You may take your regular medications prior to the test. . MAKE SURE TO TAKE YOUR TOPROL THE DAY OF THIS TESTING . HOLD Furosemide morning of the test.  After the Test: . Drink plenty of water. . After receiving IV contrast, you may experience a mild flushed feeling. This is normal. . On occasion, you may experience a mild rash up to 24 hours after the test. This is not dangerous. If this occurs, you can  take Benadryl 25 mg and increase your fluid intake. . If you experience trouble breathing, this can be serious. If it is severe call 911 IMMEDIATELY. If it is mild, please call our office. . If you take any of these medications: Glipizide/Metformin, Avandament, Glucavance, please do not take 48 hours after completing  test.    Instructions for your ablation: 1. Please arrive at the [facility], Main Entrance "A", of [facility] at 5:30 a.m. on 10/13/2017. 2. Do not eat or drink after midnight the night prior to the procedure. 3. Do not miss any doses of Eliquis prior to the morning of the procedure.  4. Do not take any medications the morning of the procedure. 5. Both of your groins will need to be shaved for this procedure (if needed). We ask that you do this yourself at home 1-2 days prior to the procedure.  If you are unable/uncomfortable to do yourself, the hospital staff will shave you the day of your procedure (if needed). 6. Plan for an overnight stay in the hospital. 7. You will need someone to drive you home at discharge.     Cardiac Ablation Cardiac ablation is a procedure to disable (ablate) a small amount of heart tissue in very specific places. The heart has many electrical connections. Sometimes these connections are abnormal and can cause the heart to beat very fast or irregularly. Ablating some of the problem areas can improve the heart rhythm or return it to normal. Ablation may be done for people who:  Have Wolff-Parkinson-White syndrome.  Have fast heart rhythms (tachycardia).  Have taken medicines for an abnormal heart rhythm (arrhythmia) that were not effective or caused side effects.  Have a high-risk heartbeat that may be life-threatening.  During the procedure, a small incision is made in the neck or the groin, and a long, thin, flexible tube (catheter) is inserted into the incision and moved to the heart. Small devices (electrodes) on the tip of the catheter will send out electrical currents. A type of X-ray (fluoroscopy) will be used to help guide the catheter and to provide images of the heart. Tell a health care provider about:  Any allergies you have.  All medicines you are taking, including vitamins, herbs, eye drops, creams, and over-the-counter  medicines.  Any problems you or family members have had with anesthetic medicines.  Any blood disorders you have.  Any surgeries you have had.  Any medical conditions you have, such as kidney failure.  Whether you are pregnant or may be pregnant. What are the risks? Generally, this is a safe procedure. However, problems may occur, including:  Infection.  Bruising and bleeding at the catheter insertion site.  Bleeding into the chest, especially into the sac that surrounds the heart. This is a serious complication.  Stroke or blood clots.  Damage to other structures or organs.  Allergic reaction to medicines or dyes.  Need for a permanent pacemaker if the normal electrical system is damaged. A pacemaker is a small computer that sends electrical signals to the heart and helps your heart beat normally.  The procedure not being fully effective. This may not be recognized until months later. Repeat ablation procedures are sometimes required.  What happens before the procedure?  Follow instructions from your health care provider about eating or drinking restrictions.  Ask your health care provider about: ? Changing or stopping your regular medicines. This is especially important if you are taking diabetes medicines or blood thinners. ? Taking medicines  such as aspirin and ibuprofen. These medicines can thin your blood. Do not take these medicines before your procedure if your health care provider instructs you not to.  Plan to have someone take you home from the hospital or clinic.  If you will be going home right after the procedure, plan to have someone with you for 24 hours. What happens during the procedure?  To lower your risk of infection: ? Your health care team will wash or sanitize their hands. ? Your skin will be washed with soap. ? Hair may be removed from the incision area.  An IV tube will be inserted into one of your veins.  You will be given a medicine to  help you relax (sedative).  The skin on your neck or groin will be numbed.  An incision will be made in your neck or your groin.  A needle will be inserted through the incision and into a large vein in your neck or groin.  A catheter will be inserted into the needle and moved to your heart.  Dye may be injected through the catheter to help your surgeon see the area of the heart that needs treatment.  Electrical currents will be sent from the catheter to ablate heart tissue in desired areas. There are three types of energy that may be used to ablate heart tissue: ? Heat (radiofrequency energy). ? Laser energy. ? Extreme cold (cryoablation).  When the necessary tissue has been ablated, the catheter will be removed.  Pressure will be held on the catheter insertion area to prevent excessive bleeding.  A bandage (dressing) will be placed over the catheter insertion area. The procedure may vary among health care providers and hospitals. What happens after the procedure?  Your blood pressure, heart rate, breathing rate, and blood oxygen level will be monitored until the medicines you were given have worn off.  Your catheter insertion area will be monitored for bleeding. You will need to lie still for a few hours to ensure that you do not bleed from the catheter insertion area.  Do not drive for 24 hours or as long as directed by your health care provider. Summary  Cardiac ablation is a procedure to disable (ablate) a small amount of heart tissue in very specific places. Ablating some of the problem areas can improve the heart rhythm or return it to normal.  During the procedure, electrical currents will be sent from the catheter to ablate heart tissue in desired areas. This information is not intended to replace advice given to you by your health care provider. Make sure you discuss any questions you have with your health care provider. Document Released: 05/08/2008 Document Revised:  11/09/2015 Document Reviewed: 11/09/2015 Elsevier Interactive Patient Education  [employer].

## 2017-09-14 LAB — CBC
HEMATOCRIT: 46.4 % (ref 37.5–51.0)
HEMOGLOBIN: 15.7 g/dL (ref 13.0–17.7)
MCH: 30.8 pg (ref 26.6–33.0)
MCHC: 33.8 g/dL (ref 31.5–35.7)
MCV: 91 fL (ref 79–97)
Platelets: 301 10*3/uL (ref 150–450)
RBC: 5.1 x10E6/uL (ref 4.14–5.80)
RDW: 13.7 % (ref 12.3–15.4)
WBC: 8 10*3/uL (ref 3.4–10.8)

## 2017-09-14 LAB — BASIC METABOLIC PANEL
BUN/Creatinine Ratio: 11 (ref 10–24)
BUN: 13 mg/dL (ref 8–27)
CHLORIDE: 97 mmol/L (ref 96–106)
CO2: 28 mmol/L (ref 20–29)
Calcium: 10.4 mg/dL — ABNORMAL HIGH (ref 8.6–10.2)
Creatinine, Ser: 1.16 mg/dL (ref 0.76–1.27)
GFR, EST AFRICAN AMERICAN: 75 mL/min/{1.73_m2} (ref >59)
GFR, EST NON AFRICAN AMERICAN: 65 mL/min/{1.73_m2} (ref >59)
Glucose: 138 mg/dL — ABNORMAL HIGH (ref 65–99)
POTASSIUM: 4.6 mmol/L (ref 3.5–5.2)
Sodium: 141 mmol/L (ref 134–144)

## 2017-09-18 ENCOUNTER — Other Ambulatory Visit: Payer: Self-pay

## 2017-09-18 NOTE — Progress Notes (Signed)
Amlodipine discontinue by afib clinic in August via phone call.  Updated Pt med list.

## 2017-10-07 ENCOUNTER — Other Ambulatory Visit: Payer: Self-pay | Admitting: Cardiovascular Disease

## 2017-10-10 ENCOUNTER — Ambulatory Visit (HOSPITAL_COMMUNITY)
Admission: RE | Admit: 2017-10-10 | Discharge: 2017-10-10 | Disposition: A | Discharge status: Home / Self Care | Payer: Medicare Other | Source: Ambulatory Visit | Attending: Internal Medicine | Admitting: Internal Medicine

## 2017-10-10 ENCOUNTER — Ambulatory Visit (HOSPITAL_COMMUNITY): Payer: Medicare Other

## 2017-10-10 ENCOUNTER — Encounter (HOSPITAL_COMMUNITY): Payer: Self-pay

## 2017-10-10 DIAGNOSIS — I480 Paroxysmal atrial fibrillation: Secondary | ICD-10-CM | POA: Diagnosis not present

## 2017-10-10 MED ORDER — IOPAMIDOL (ISOVUE-370) INJECTION 76%
100.0000 mL | Freq: Once | INTRAVENOUS | Status: AC | PRN
  Administered 2017-10-10: 80 mL via INTRAVENOUS

## 2017-10-13 ENCOUNTER — Ambulatory Visit (HOSPITAL_COMMUNITY): Payer: Medicare Other | Admitting: Critical Care Medicine

## 2017-10-13 ENCOUNTER — Encounter (HOSPITAL_COMMUNITY)
Admission: RE | Disposition: A | Discharge status: Home / Self Care | Payer: Self-pay | Source: Ambulatory Visit | Attending: Internal Medicine

## 2017-10-13 ENCOUNTER — Other Ambulatory Visit: Payer: Self-pay

## 2017-10-13 ENCOUNTER — Ambulatory Visit (HOSPITAL_COMMUNITY)
Admission: RE | Admit: 2017-10-13 | Discharge: 2017-10-13 | Disposition: A | Discharge status: Home / Self Care | Payer: Medicare Other | Source: Ambulatory Visit | Attending: Internal Medicine | Admitting: Internal Medicine

## 2017-10-13 DIAGNOSIS — Z6829 Body mass index (BMI) 29.0-29.9, adult: Secondary | ICD-10-CM | POA: Diagnosis not present

## 2017-10-13 DIAGNOSIS — Z79899 Other long term (current) drug therapy: Secondary | ICD-10-CM | POA: Insufficient documentation

## 2017-10-13 DIAGNOSIS — I483 Typical atrial flutter: Secondary | ICD-10-CM | POA: Diagnosis not present

## 2017-10-13 DIAGNOSIS — I4819 Other persistent atrial fibrillation: Secondary | ICD-10-CM | POA: Insufficient documentation

## 2017-10-13 DIAGNOSIS — Z7984 Long term (current) use of oral hypoglycemic drugs: Secondary | ICD-10-CM | POA: Insufficient documentation

## 2017-10-13 DIAGNOSIS — I4891 Unspecified atrial fibrillation: Secondary | ICD-10-CM | POA: Diagnosis not present

## 2017-10-13 DIAGNOSIS — E785 Hyperlipidemia, unspecified: Secondary | ICD-10-CM | POA: Insufficient documentation

## 2017-10-13 DIAGNOSIS — K219 Gastro-esophageal reflux disease without esophagitis: Secondary | ICD-10-CM | POA: Diagnosis not present

## 2017-10-13 DIAGNOSIS — I484 Atypical atrial flutter: Secondary | ICD-10-CM | POA: Insufficient documentation

## 2017-10-13 DIAGNOSIS — I10 Essential (primary) hypertension: Secondary | ICD-10-CM | POA: Diagnosis not present

## 2017-10-13 DIAGNOSIS — E119 Type 2 diabetes mellitus without complications: Secondary | ICD-10-CM | POA: Insufficient documentation

## 2017-10-13 DIAGNOSIS — Z9889 Other specified postprocedural states: Secondary | ICD-10-CM | POA: Diagnosis not present

## 2017-10-13 DIAGNOSIS — Z7901 Long term (current) use of anticoagulants: Secondary | ICD-10-CM | POA: Diagnosis not present

## 2017-10-13 DIAGNOSIS — E663 Overweight: Secondary | ICD-10-CM | POA: Diagnosis not present

## 2017-10-13 HISTORY — PX: ATRIAL FIBRILLATION ABLATION: EP1191

## 2017-10-13 LAB — POCT ACTIVATED CLOTTING TIME
ACTIVATED CLOTTING TIME: 279 s
ACTIVATED CLOTTING TIME: 169 s
Activated Clotting Time: 312 seconds

## 2017-10-13 LAB — GLUCOSE, CAPILLARY
GLUCOSE-CAPILLARY: 162 mg/dL — AB (ref 70–99)
Glucose-Capillary: 153 mg/dL — ABNORMAL HIGH (ref 70–99)

## 2017-10-13 SURGERY — ATRIAL FIBRILLATION ABLATION
Anesthesia: General

## 2017-10-13 MED ORDER — SODIUM CHLORIDE 0.9% FLUSH: 3.0000 mL | Freq: Two times a day (BID) | INTRAVENOUS | Status: DC

## 2017-10-13 MED ORDER — HYDRALAZINE HCL 20 MG/ML IJ SOLN
INTRAMUSCULAR | Status: DC | PRN
  Administered 2017-10-13: 10 mg via INTRAVENOUS

## 2017-10-13 MED ORDER — HEPARIN (PORCINE) IN NACL 1000-0.9 UT/500ML-% IV SOLN
INTRAVENOUS | Status: DC | PRN
  Administered 2017-10-13: 500 mL

## 2017-10-13 MED ORDER — ONDANSETRON HCL 4 MG/2ML IJ SOLN
INTRAMUSCULAR | Status: DC | PRN
  Administered 2017-10-13: 4 mg via INTRAVENOUS

## 2017-10-13 MED ORDER — HEPARIN SODIUM (PORCINE) 1000 UNIT/ML IJ SOLN
INTRAMUSCULAR | Status: DC | PRN
  Administered 2017-10-13: 12000 [IU] via INTRAVENOUS

## 2017-10-13 MED ORDER — PROTAMINE SULFATE 10 MG/ML IV SOLN
INTRAVENOUS | Status: DC | PRN
  Administered 2017-10-13: 40 mg via INTRAVENOUS

## 2017-10-13 MED ORDER — HEPARIN SODIUM (PORCINE) 1000 UNIT/ML IJ SOLN
INTRAMUSCULAR | Status: DC | PRN
  Administered 2017-10-13: 1000 [IU] via INTRAVENOUS

## 2017-10-13 MED ORDER — FENTANYL CITRATE (PF) 100 MCG/2ML IJ SOLN
INTRAMUSCULAR | Status: DC | PRN
  Administered 2017-10-13: 50 ug via INTRAVENOUS

## 2017-10-13 MED ORDER — HEPARIN SODIUM (PORCINE) 1000 UNIT/ML IJ SOLN
INTRAMUSCULAR | Status: AC
  Filled 2017-10-13: qty 1

## 2017-10-13 MED ORDER — ADENOSINE 6 MG/2ML IV SOLN
INTRAVENOUS | Status: AC
  Filled 2017-10-13: qty 2

## 2017-10-13 MED ORDER — BUPIVACAINE HCL (PF) 0.25 % IJ SOLN
INTRAMUSCULAR | Status: DC | PRN
  Administered 2017-10-13: 20 mL

## 2017-10-13 MED ORDER — LIDOCAINE 2% (20 MG/ML) 5 ML SYRINGE
INTRAMUSCULAR | Status: DC | PRN
  Administered 2017-10-13: 70 mg via INTRAVENOUS

## 2017-10-13 MED ORDER — AMLODIPINE BESYLATE 5 MG PO TABS: 5.0000 mg | ORAL_TABLET | Freq: Every day | ORAL | 11 refills | Status: DC

## 2017-10-13 MED ORDER — ACETAMINOPHEN 325 MG PO TABS
650.0000 mg | ORAL_TABLET | ORAL | Status: DC | PRN
  Filled 2017-10-13: qty 2

## 2017-10-13 MED ORDER — SODIUM CHLORIDE 0.9 % IV SOLN
INTRAVENOUS | Status: DC | PRN
  Administered 2017-10-13: 25 ug/min via INTRAVENOUS

## 2017-10-13 MED ORDER — ISOPROTERENOL HCL 0.2 MG/ML IJ SOLN
INTRAMUSCULAR | Status: AC
  Filled 2017-10-13: qty 5

## 2017-10-13 MED ORDER — SUGAMMADEX SODIUM 200 MG/2ML IV SOLN
INTRAVENOUS | Status: DC | PRN
  Administered 2017-10-13 (×2): 100 mg via INTRAVENOUS

## 2017-10-13 MED ORDER — PROPOFOL 10 MG/ML IV BOLUS
INTRAVENOUS | Status: DC | PRN
  Administered 2017-10-13: 100 mg via INTRAVENOUS
  Administered 2017-10-13: 50 mg via INTRAVENOUS

## 2017-10-13 MED ORDER — HYDRALAZINE HCL 25 MG PO TABS: 25.0000 mg | ORAL_TABLET | Freq: Three times a day (TID) | ORAL | Status: DC | PRN

## 2017-10-13 MED ORDER — ADENOSINE 6 MG/2ML IV SOLN
INTRAVENOUS | Status: DC | PRN
  Administered 2017-10-13: 12 mg via INTRAVENOUS

## 2017-10-13 MED ORDER — ISOPROTERENOL HCL 0.2 MG/ML IJ SOLN
INTRAVENOUS | Status: DC | PRN
  Administered 2017-10-13: 20 ug/min via INTRAVENOUS

## 2017-10-13 MED ORDER — SODIUM CHLORIDE 0.9 % IV SOLN: 250.0000 mL | INTRAVENOUS | Status: DC | PRN

## 2017-10-13 MED ORDER — MIDAZOLAM HCL 5 MG/5ML IJ SOLN
INTRAMUSCULAR | Status: DC | PRN
  Administered 2017-10-13: 2 mg via INTRAVENOUS

## 2017-10-13 MED ORDER — APIXABAN 5 MG PO TABS: 5.0000 mg | ORAL_TABLET | Freq: Once | ORAL | Status: DC

## 2017-10-13 MED ORDER — SODIUM CHLORIDE 0.9% FLUSH: 3.0000 mL | INTRAVENOUS | Status: DC | PRN

## 2017-10-13 MED ORDER — METOPROLOL SUCCINATE ER 50 MG PO TB24: 50.0000 mg | ORAL_TABLET | Freq: Every day | ORAL | 3 refills | Status: DC

## 2017-10-13 MED ORDER — ONDANSETRON HCL 4 MG/2ML IJ SOLN: 4.0000 mg | Freq: Four times a day (QID) | INTRAMUSCULAR | Status: DC | PRN

## 2017-10-13 MED ORDER — ROCURONIUM BROMIDE 50 MG/5ML IV SOSY
PREFILLED_SYRINGE | INTRAVENOUS | Status: DC | PRN
  Administered 2017-10-13: 50 mg via INTRAVENOUS

## 2017-10-13 MED ORDER — DEXAMETHASONE SODIUM PHOSPHATE 10 MG/ML IJ SOLN
INTRAMUSCULAR | Status: DC | PRN
  Administered 2017-10-13: 4 mg via INTRAVENOUS

## 2017-10-13 MED ORDER — BUPIVACAINE HCL (PF) 0.25 % IJ SOLN
INTRAMUSCULAR | Status: AC
  Filled 2017-10-13: qty 30

## 2017-10-13 MED ORDER — HYDROCODONE-ACETAMINOPHEN 5-325 MG PO TABS: 1.0000 | ORAL_TABLET | ORAL | Status: DC | PRN

## 2017-10-13 MED ORDER — SODIUM CHLORIDE 0.9 % IV SOLN
INTRAVENOUS | Status: DC
  Administered 2017-10-13 (×2): via INTRAVENOUS

## 2017-10-13 MED ORDER — HEPARIN SODIUM (PORCINE) 1000 UNIT/ML IJ SOLN
INTRAMUSCULAR | Status: DC | PRN
  Administered 2017-10-13: 3000 [IU] via INTRAVENOUS
  Administered 2017-10-13: 4000 [IU] via INTRAVENOUS

## 2017-10-13 SURGICAL SUPPLY — 17 items
BLANKET WARM UNDERBOD FULL ACC (MISCELLANEOUS) ×3 IMPLANT
CATH MAPPNG PENTARAY F 2-6-2MM (CATHETERS) ×1 IMPLANT
CATH NAVISTAR SMARTTOUCH DF (ABLATOR) ×3 IMPLANT
CATH SOUNDSTAR 3D IMAGING (CATHETERS) ×3 IMPLANT
CATH WEBSTER BI DIR CS D-F CRV (CATHETERS) ×3 IMPLANT
COVER SWIFTLINK CONNECTOR (BAG) ×3 IMPLANT
NEEDLE BAYLIS TRANSSEPTAL 71CM (NEEDLE) ×3 IMPLANT
PACK EP LATEX FREE (CUSTOM PROCEDURE TRAY) ×2
PACK EP LF (CUSTOM PROCEDURE TRAY) ×1 IMPLANT
PAD PRO RADIOLUCENT 2001M-C (PAD) ×3 IMPLANT
PATCH CARTO3 (PAD) ×3 IMPLANT
PENTARAY F 2-6-2MM (CATHETERS) ×3
SHEATH AVANTI 11F 11CM (SHEATH) ×3 IMPLANT
SHEATH PINNACLE 7F 10CM (SHEATH) ×6 IMPLANT
SHEATH PINNACLE 9F 10CM (SHEATH) ×3 IMPLANT
SHEATH SWARTZ TS SL2 63CM 8.5F (SHEATH) ×6 IMPLANT
TUBING SMART ABLATE COOLFLOW (TUBING) ×3 IMPLANT

## 2017-10-13 NOTE — Interval H&P Note (Signed)
History and Physical Interval Note:  10/13/2017 7:27 AM  Nathan Sutton  has presented today for surgery, with the diagnosis of afib  The various methods of treatment have been discussed with the patient and family. After consideration of risks, benefits and other options for treatment, the patient has consented to  Procedure(s): ATRIAL FIBRILLATION ABLATION (N/A) as a surgical intervention .  The patient's history has been reviewed, patient examined, no change in status, stable for surgery.  I have reviewed the patient's chart and labs.  Questions were answered to the patient's satisfaction.    Cardiac CT reviewed with patient.  He reports compliance with eliqus without interruption.  Thompson Grayer

## 2017-10-13 NOTE — Transfer of Care (Signed)
Immediate Anesthesia Transfer of Care Note  Patient: Nathan Sutton  Procedure(s) Performed: ATRIAL FIBRILLATION ABLATION (N/A )  Patient Location: PACU and Cath Lab  Anesthesia Type:General  Level of Consciousness: awake, alert  and oriented  Airway & Oxygen Therapy: Patient Spontanous Breathing and Patient connected to nasal cannula oxygen  Post-op Assessment: Report given to RN and Post -op Vital signs reviewed and stable  Post vital signs: Reviewed and stable  Last Vitals:  Vitals Value Taken Time  BP 142/65 10/13/2017 10:10 AM  Temp 36.1 C 10/13/2017 10:00 AM  Pulse 67 10/13/2017 10:13 AM  Resp 22 10/13/2017 10:13 AM  SpO2 97 % 10/13/2017 10:13 AM  Vitals shown include unvalidated device data.  Last Pain:  Vitals:   10/13/17 1000  TempSrc: Temporal  PainSc: 0-No pain      Patients Stated Pain Goal: 2 (88/87/57 9728)  Complications: No apparent anesthesia complications

## 2017-10-13 NOTE — Progress Notes (Signed)
Rennis Harding, Rn called and informed of BP

## 2017-10-13 NOTE — Discharge Instructions (Signed)
Post procedure care instructions No driving for 4 days. No lifting over 5 lbs for 1 week. No sexual activity for 1 week. You may return to work on 10/20/17. Keep procedure site clean & dry. If you notice increased pain, swelling, bleeding or pus, call/return!  You may shower, but no soaking baths/hot tubs/pools for 1 week.   You have an appointment set up with the [facility].  Multiple studies have shown that being followed by a dedicated atrial fibrillation clinic in addition to the standard care you receive from your other physicians improves health. We believe that enrollment in the atrial fibrillation clinic will allow [place] to better care for you.   The phone number to the [facility] is [phone]. The clinic is staffed Monday through Friday from 8:30am to 5pm.  Parking Directions: The clinic is located in the Heart and Vascular Building connected to [facility]. 1)From [address] turn on to [address] and go to the 3rd entrance  (Heart and Vascular entrance) on the right. 2)Look to the right for Heart &Vascular Parking Garage. 3)A code for the entrance is required please call the clinic to receive this.   4)Take the elevators to the 1st floor. Registration is in the room with the glass walls at the end of the hallway.  If you have any trouble parking or locating the clinic, please dont hesitate to call [phone].     Cardiac Ablation, Care After This sheet gives you information about how to care for yourself after your procedure. Your health care provider may also give you more specific instructions. If you have problems or questions, contact your health care provider. What can I expect after the procedure? After the procedure, it is common to have:  Bruising around your puncture site.  Tenderness around your puncture site.  Skipped heartbeats.  Tiredness (fatigue).  Follow these instructions at home: Puncture site  care  Follow instructions from your health care provider about how to take care of your puncture site. Make sure you: ? Wash your hands with soap and water before you change your bandage (dressing). If soap and water are not available, use hand sanitizer. ? Change your dressing as told by your health care provider. ? Leave stitches (sutures), skin glue, or adhesive strips in place. These skin closures may need to stay in place for up to 2 weeks. If adhesive strip edges start to loosen and curl up, you may trim the loose edges. Do not remove adhesive strips completely unless your health care provider tells you to do that.  Check your puncture site every day for signs of infection. Check for: ? Redness, swelling, or pain. ? Fluid or blood. If your puncture site starts to bleed, lie down on your back, apply firm pressure to the area, and contact your health care provider. ? Warmth. ? Pus or a bad smell. Driving  Ask your health care provider when it is safe for you to drive again after the procedure.  Do not drive or use heavy machinery while taking prescription pain medicine.  Do not drive for 24 hours if you were given a medicine to help you relax (sedative) during your procedure. Activity  Avoid activities that take a lot of effort for at least 3 days after your procedure.  Do not lift anything that is heavier than 10 lb (4.5 kg), or the limit that you are told, until your health care provider says that it is safe.  Return to  your normal activities as told by your health care provider. Ask your health care provider what activities are safe for you. General instructions  Take over-the-counter and prescription medicines only as told by your health care provider.  Do not use any products that contain nicotine or tobacco, such as cigarettes and e-cigarettes. If you need help quitting, ask your health care provider.  Do not take baths, swim, or use a hot tub until your health care provider  approves.  Do not drink alcohol for 24 hours after your procedure.  Keep all follow-up visits as told by your health care provider. This is important. Contact a health care provider if:  You have redness, mild swelling, or pain around your puncture site.  You have fluid or blood coming from your puncture site that stops after applying firm pressure to the area.  Your puncture site feels warm to the touch.  You have pus or a bad smell coming from your puncture site.  You have a fever.  You have chest pain or discomfort that spreads to your neck, jaw, or arm.  You are sweating a lot.  You feel nauseous.  You have a fast or irregular heartbeat.  You have shortness of breath.  You are dizzy or light-headed and feel the need to lie down.  You have pain or numbness in the arm or leg closest to your puncture site. Get help right away if:  Your puncture site suddenly swells.  Your puncture site is bleeding and the bleeding does not stop after applying firm pressure to the area. These symptoms may represent a serious problem that is an emergency. Do not wait to see if the symptoms will go away. Get medical help right away. Call your local emergency services (911 in the [place] Do not drive yourself to the hospital. Summary  After the procedure, it is normal to have bruising and tenderness at the puncture site in your groin, neck, or forearm.  Check your puncture site every day for signs of infection.  Get help right away if your puncture site is bleeding and the bleeding does not stop after applying firm pressure to the area. This is a medical emergency. This information is not intended to replace advice given to you by your health care provider. Make sure you discuss any questions you have with your health care provider. Document Released: 03/31/2016 Document Revised: 03/31/2016 Document Reviewed: 03/31/2016 Elsevier Interactive Patient Education  2018 [employer].

## 2017-10-13 NOTE — Anesthesia Procedure Notes (Signed)
Procedure Name: Intubation Date/Time: 10/13/2017 7:52 AM Performed by: Trinna Post., CRNA Pre-anesthesia Checklist: Patient identified, Emergency Drugs available, Suction available, Patient being monitored and Timeout performed Patient Re-evaluated:Patient Re-evaluated prior to induction Oxygen Delivery Method: Circle system utilized Preoxygenation: Pre-oxygenation with 100% oxygen Induction Type: IV induction Ventilation: Oral airway inserted - appropriate to patient size and Mask ventilation without difficulty Laryngoscope Size: Mac and 4 Grade View: Grade I Tube type: Oral Tube size: 7.5 mm Number of attempts: 1 Airway Equipment and Method: Stylet Placement Confirmation: ETT inserted through vocal cords under direct vision,  positive ETCO2 and breath sounds checked- equal and bilateral Secured at: 22 cm Tube secured with: Tape Dental Injury: Teeth and Oropharynx as per pre-operative assessment

## 2017-10-13 NOTE — Anesthesia Postprocedure Evaluation (Signed)
Anesthesia Post Note  Patient: Nathan Sutton  Procedure(s) Performed: ATRIAL FIBRILLATION ABLATION (N/A )     Patient location during evaluation: PACU Anesthesia Type: General Level of consciousness: awake and alert Pain management: pain level controlled Vital Signs Assessment: post-procedure vital signs reviewed and stable Respiratory status: spontaneous breathing, nonlabored ventilation, respiratory function stable and patient connected to nasal cannula oxygen Cardiovascular status: blood pressure returned to baseline and stable Postop Assessment: no apparent nausea or vomiting Anesthetic complications: no    Last Vitals:  Vitals:   10/13/17 1300 10/13/17 1401  BP: 134/67 (!) 166/72  Pulse: 71 72  Resp: 20 19  Temp:    SpO2: 93% 96%    Last Pain:  Vitals:   10/13/17 1439  TempSrc:   PainSc: 0-No pain                 Ryan P Ellender

## 2017-10-13 NOTE — Anesthesia Preprocedure Evaluation (Addendum)
Anesthesia Evaluation  Patient identified by MRN, date of birth, ID band Patient awake    Reviewed: Allergy & Precautions, NPO status , Patient's Chart, lab work & pertinent test results, reviewed documented beta blocker date and time   Airway Mallampati: III  TM Distance: >3 FB Neck ROM: Full    Dental  (+) Missing,    Pulmonary neg pulmonary ROS,    Pulmonary exam normal breath sounds clear to auscultation       Cardiovascular hypertension, Pt. on medications and Pt. on home beta blockers Normal cardiovascular exam+ dysrhythmias Atrial Fibrillation  Rhythm:Regular Rate:Normal  ECG: SB, 1st degree AV block, rate 57  ECHO: Left ventricle:  Normal LVEF  No evidence of thrombus.   Neuro/Psych negative neurological ROS  negative psych ROS   GI/Hepatic Neg liver ROS, GERD  Controlled,  Endo/Other  diabetes, Oral Hypoglycemic Agents  Renal/GU negative Renal ROS     Musculoskeletal negative musculoskeletal ROS (+)   Abdominal   Peds  Hematology HLD   Anesthesia Other Findings afib  Reproductive/Obstetrics                            Anesthesia Physical Anesthesia Plan  ASA: III  Anesthesia Plan: General   Post-op Pain Management:    Induction: Intravenous  PONV Risk Score and Plan: 2 and Ondansetron, Dexamethasone, Midazolam and Treatment may vary due to age or medical condition  Airway Management Planned: Oral ETT  Additional Equipment:   Intra-op Plan:   Post-operative Plan: Extubation in OR  Informed Consent: I have reviewed the patients History and Physical, chart, labs and discussed the procedure including the risks, benefits and alternatives for the proposed anesthesia with the patient or authorized representative who has indicated his/her understanding and acceptance.   Dental advisory given  Plan Discussed with: CRNA  Anesthesia Plan Comments:         Anesthesia  Quick Evaluation

## 2017-10-13 NOTE — Progress Notes (Signed)
Site area: 3 rt fv sheaths Site Prior to Removal:  Level 0 Pressure Applied For:  20 minutes Manual:   yes Patient Status During Pull:  stable Post Pull Site:  Level  0 Post Pull Instructions Given:  yes Post Pull Pulses Present: rt dp palpable Dressing Applied:  Gauze and tegaderm Bedrest begins @ 1040 Comments:  IV saline locked

## 2017-10-16 ENCOUNTER — Encounter (HOSPITAL_COMMUNITY): Payer: Self-pay | Admitting: Internal Medicine

## 2017-10-18 ENCOUNTER — Encounter: Payer: Self-pay | Admitting: Internal Medicine

## 2017-10-24 ENCOUNTER — Telehealth: Payer: Self-pay

## 2017-10-24 NOTE — Telephone Encounter (Signed)
Please call patient with further advice in regards to the general rash that he has recently developed post ablation and the difficulty with (swallowing/ indigestion, although this has improved.)

## 2017-10-24 NOTE — Telephone Encounter (Signed)
Lpmtcb 10/22

## 2017-10-24 NOTE — Telephone Encounter (Signed)
° °  Please return call to patient at [phone]

## 2017-10-25 MED ORDER — PANTOPRAZOLE SODIUM 40 MG PO TBEC: 40.0000 mg | DELAYED_RELEASE_TABLET | Freq: Two times a day (BID) | ORAL | 3 refills | Status: DC

## 2017-10-25 NOTE — Telephone Encounter (Signed)
Per Dr. Rayann Heman- ok to take Benadryl  Increase protonix 40 mg to BiD  Message sent via MyChart

## 2017-10-27 DIAGNOSIS — Z6829 Body mass index (BMI) 29.0-29.9, adult: Secondary | ICD-10-CM | POA: Diagnosis not present

## 2017-10-27 DIAGNOSIS — L239 Allergic contact dermatitis, unspecified cause: Secondary | ICD-10-CM | POA: Diagnosis not present

## 2017-11-07 DIAGNOSIS — E11319 Type 2 diabetes mellitus with unspecified diabetic retinopathy without macular edema: Secondary | ICD-10-CM | POA: Diagnosis not present

## 2017-11-10 ENCOUNTER — Ambulatory Visit (HOSPITAL_COMMUNITY)
Admission: RE | Admit: 2017-11-10 | Discharge: 2017-11-10 | Disposition: A | Discharge status: Home / Self Care | Payer: Medicare Other | Source: Ambulatory Visit | Attending: Nurse Practitioner | Admitting: Nurse Practitioner

## 2017-11-10 ENCOUNTER — Encounter (HOSPITAL_COMMUNITY): Payer: Self-pay | Admitting: Nurse Practitioner

## 2017-11-10 VITALS — BP 148/86 | HR 76 | Ht 73.0 in | Wt 221.0 lb

## 2017-11-10 DIAGNOSIS — Z79899 Other long term (current) drug therapy: Secondary | ICD-10-CM | POA: Insufficient documentation

## 2017-11-10 DIAGNOSIS — I10 Essential (primary) hypertension: Secondary | ICD-10-CM | POA: Diagnosis not present

## 2017-11-10 DIAGNOSIS — E119 Type 2 diabetes mellitus without complications: Secondary | ICD-10-CM | POA: Diagnosis not present

## 2017-11-10 DIAGNOSIS — I480 Paroxysmal atrial fibrillation: Secondary | ICD-10-CM | POA: Diagnosis not present

## 2017-11-10 DIAGNOSIS — I4891 Unspecified atrial fibrillation: Secondary | ICD-10-CM | POA: Diagnosis not present

## 2017-11-10 DIAGNOSIS — Z7984 Long term (current) use of oral hypoglycemic drugs: Secondary | ICD-10-CM | POA: Insufficient documentation

## 2017-11-10 DIAGNOSIS — E785 Hyperlipidemia, unspecified: Secondary | ICD-10-CM | POA: Diagnosis not present

## 2017-11-10 DIAGNOSIS — Z7901 Long term (current) use of anticoagulants: Secondary | ICD-10-CM | POA: Insufficient documentation

## 2017-11-10 NOTE — Progress Notes (Signed)
Primary Care Physician: Manon Hilding, MD Referring Physician:Dr. Shelton Silvas   Nathan Sutton is a 67 y.o. male with a h/o afib s/p ablation 03/02/2017 and most recent ablation 10/112019 for recurrent afib. Since second ablation, he has not noted any afib. No swallowing or groin issues. He did have a generalized pruritic rash that occurred around 9 days after the ablation that took steroids/anihistamines to resolve. No new meds, ? Etiology of rash.   Today, he denies symptoms of palpitations, chest pain, shortness of breath, orthopnea, PND, lower extremity edema, dizziness, presyncope, syncope, or neurologic sequela. The patient is tolerating medications without difficulties and is otherwise without complaint today.   Past Medical History:  Diagnosis Date  . Atrial fibrillation ([place])   . Dyslipidemia   . Ejection fraction    EF 55-60%, echo, May, 2014  . Heart murmur    As a Child  . Hypertension   . Overweight(278.02)   . Type II diabetes mellitus ([place])    Past Surgical History:  Procedure Laterality Date  . ATRIAL FIBRILLATION ABLATION N/A 03/02/2017   Procedure: ATRIAL FIBRILLATION ABLATION;  Surgeon: Thompson Grayer, MD;  Location: [facility];  Service: Cardiovascular;  Laterality: N/A;  . ATRIAL FIBRILLATION ABLATION N/A 10/13/2017   Procedure: ATRIAL FIBRILLATION ABLATION;  Surgeon: Thompson Grayer, MD;  Location: [facility];  Service: Cardiovascular;  Laterality: N/A;  . BACK SURGERY    . CARDIOVERSION N/A 08/11/2017   Procedure: CARDIOVERSION;  Surgeon: Fay Records, MD;  Location: [facility];  Service: Cardiovascular;  Laterality: N/A;  . DECOMPRESSIVE LUMBAR LAMINECTOMY LEVEL 2 Right 08/2009   L3-4; L4-5/notes 08/22/2009  . TEE WITHOUT CARDIOVERSION N/A 08/11/2017   Procedure: TRANSESOPHAGEAL ECHOCARDIOGRAM (TEE) WITH PROPOFOL;  Surgeon: Fay Records, MD;  Location: [facility];  Service: Cardiovascular;  Laterality: N/A;    Current Outpatient Medications  Medication Sig  Dispense Refill  . acetaminophen (TYLENOL) 500 MG tablet Take 500 mg by mouth 3 (three) times daily as needed (for pain.).     Marland Kitchen amLODipine (NORVASC) 5 MG tablet Take 1 tablet (5 mg total) by mouth daily. 30 tablet 11  . apixaban (ELIQUIS) 5 MG TABS tablet Take 1 tablet (5 mg total) by mouth 2 (two) times daily. 180 tablet 3  . atorvastatin (LIPITOR) 10 MG tablet Take 5 mg by mouth every Monday, Wednesday, and Friday.     . cetirizine (ZYRTEC) 10 MG tablet Take 5-10 mg by mouth daily as needed for allergies.     . cyclobenzaprine (FLEXERIL) 10 MG tablet Take 10 mg by mouth daily as needed for muscle spasms.    Marland Kitchen gabapentin (NEURONTIN) 300 MG capsule Take 300 mg by mouth at bedtime.    Marland Kitchen lisinopril-hydrochlorothiazide (PRINZIDE,ZESTORETIC) 20-12.5 MG per tablet Take 2 tablets by mouth daily.     . Melatonin 5 MG TABS Take 5 mg by mouth at bedtime.    . metFORMIN (GLUCOPHAGE-XR) 500 MG 24 hr tablet Take 1,000 mg by mouth 2 (two) times daily.   1  . metoprolol succinate (TOPROL-XL) 50 MG 24 hr tablet Take 1 tablet (50 mg total) by mouth daily. 60 tablet 3  . pantoprazole (PROTONIX) 40 MG tablet Take 1 tablet (40 mg total) by mouth 2 (two) times daily. 90 tablet 3  . Polyethyl Glycol-Propyl Glycol (LUBRICANT EYE DROPS) 0.4-0.3 % SOLN Place 1 drop into both eyes 4 (four) times daily as needed (for dry eyes).     . ranitidine (ZANTAC) 150 MG  tablet Take 150 mg by mouth 2 (two) times daily.     No current facility-administered medications for this encounter.     No Known Allergies  Social History   Socioeconomic History  . Marital status: Married    Spouse name: Not on file  . Number of children: Not on file  . Years of education: Not on file  . Highest education level: Not on file  Occupational History  . Not on file  Social Needs  . Financial resource strain: Not on file  . Food insecurity:    Worry: Not on file    Inability: Not on file  . Transportation needs:    Medical: Not on  file    Non-medical: Not on file  Tobacco Use  . Smoking status: Never Smoker  . Smokeless tobacco: Never Used  Substance and Sexual Activity  . Alcohol use: No    Alcohol/week: 0.0 standard drinks    Frequency: Never  . Drug use: No  . Sexual activity: Not Currently  Lifestyle  . Physical activity:    Days per week: Not on file    Minutes per session: Not on file  . Stress: Not on file  Relationships  . Social connections:    Talks on phone: Patient refused    Gets together: Patient refused    Attends religious service: Patient refused    Active member of club or organization: Patient refused    Attends meetings of clubs or organizations: Patient refused    Relationship status: Patient refused  . Intimate partner violence:    Fear of current or ex partner: Patient refused    Emotionally abused: Patient refused    Physically abused: Patient refused    Forced sexual activity: Patient refused  Other Topics Concern  . Not on file  Social History Narrative  . Not on file    Family History  Problem Relation Age of Onset  . Heart failure Mother   . Stroke Father     ROS- All systems are reviewed and negative except as per the HPI above  Physical Exam: Vitals:   11/10/17 1125  BP: (!) 148/86  Pulse: 76  Weight: 100.2 kg  Height: 6\' 1"  (1.854 m)   Wt Readings from Last 3 Encounters:  11/10/17 100.2 kg  10/13/17 100.8 kg  09/13/17 100.8 kg    Labs: Lab Results  Component Value Date   NA 141 09/13/2017   K 4.6 09/13/2017   CL 97 09/13/2017   CO2 28 09/13/2017   GLUCOSE 138 (H) 09/13/2017   BUN 13 09/13/2017   CREATININE 1.16 09/13/2017   CALCIUM 10.4 (H) 09/13/2017   Lab Results  Component Value Date   INR 0.91 08/17/2009   No results found for: CHOL, HDL, LDLCALC, TRIG   GEN- The patient is well appearing, alert and oriented x 3 today.   Head- normocephalic, atraumatic Eyes-  Sclera clear, conjunctiva pink Ears- hearing intact Oropharynx-  clear Neck- supple, no JVP Lymph- no cervical lymphadenopathy Lungs- Clear to ausculation bilaterally, normal work of breathing Heart- Regular rate and rhythm, no murmurs, rubs or gallops, PMI not laterally displaced GI- soft, NT, ND, + BS Extremities- no clubbing, cyanosis, or edema MS- no significant deformity or atrophy Skin- no rash or lesion Psych- euthymic mood, full affect Neuro- strength and sensation are intact  EKG-NSR at 76 bpm, PR int 160 ms, qrs int 80 ms, qtc 405 ms Epic records reviewed    Assessment and Plan: 1.  Afib S/p second ablation Has been maintaining  SR No swallowing or groin issues Rash resolved  2. Chadsvasc score of  2 Continue eliquis 5 mg bid  Reminded not to interrupt anticoagulation for the remaining 2 months following ablation  3. Htn Stable   F/u with Dr. Rayann Heman  01/17/2018   Geroge Baseman. Shailah Gibbins, [facility] [address] [place], [place] [address] [phone]

## 2017-11-16 DIAGNOSIS — E1165 Type 2 diabetes mellitus with hyperglycemia: Secondary | ICD-10-CM | POA: Diagnosis not present

## 2017-11-16 DIAGNOSIS — E782 Mixed hyperlipidemia: Secondary | ICD-10-CM | POA: Diagnosis not present

## 2017-11-16 DIAGNOSIS — Z6828 Body mass index (BMI) 28.0-28.9, adult: Secondary | ICD-10-CM | POA: Diagnosis not present

## 2017-11-16 DIAGNOSIS — Z23 Encounter for immunization: Secondary | ICD-10-CM | POA: Diagnosis not present

## 2017-11-16 DIAGNOSIS — E1140 Type 2 diabetes mellitus with diabetic neuropathy, unspecified: Secondary | ICD-10-CM | POA: Diagnosis not present

## 2017-11-16 DIAGNOSIS — R800 Isolated proteinuria: Secondary | ICD-10-CM | POA: Diagnosis not present

## 2017-11-16 DIAGNOSIS — I4891 Unspecified atrial fibrillation: Secondary | ICD-10-CM | POA: Diagnosis not present

## 2017-11-16 DIAGNOSIS — E1129 Type 2 diabetes mellitus with other diabetic kidney complication: Secondary | ICD-10-CM | POA: Diagnosis not present

## 2017-11-21 ENCOUNTER — Other Ambulatory Visit: Payer: Self-pay

## 2018-01-15 ENCOUNTER — Ambulatory Visit: Payer: Medicare Other | Admitting: Internal Medicine

## 2018-01-17 ENCOUNTER — Ambulatory Visit: Payer: Medicare Other | Admitting: Internal Medicine

## 2018-02-12 ENCOUNTER — Ambulatory Visit (INDEPENDENT_AMBULATORY_CARE_PROVIDER_SITE_OTHER): Payer: Medicare Other | Admitting: Internal Medicine

## 2018-02-12 ENCOUNTER — Encounter: Payer: Self-pay | Admitting: Internal Medicine

## 2018-02-12 VITALS — BP 152/84 | HR 75 | Ht 73.0 in | Wt 218.6 lb

## 2018-02-12 DIAGNOSIS — I4892 Unspecified atrial flutter: Secondary | ICD-10-CM | POA: Diagnosis not present

## 2018-02-12 DIAGNOSIS — I480 Paroxysmal atrial fibrillation: Secondary | ICD-10-CM | POA: Diagnosis not present

## 2018-02-12 DIAGNOSIS — I10 Essential (primary) hypertension: Secondary | ICD-10-CM

## 2018-02-12 NOTE — Patient Instructions (Addendum)
Medication Instructions:  Your physician recommends that you continue on your current medications as directed. Please refer to the Current Medication list given to you today.  Labwork: None ordered.  Testing/Procedures: None ordered.  Follow-Up: Your physician wants you to follow-up in: 3 months with Roderic Palau.   You will receive a reminder letter in the mail two months in advance. If you don't receive a letter, please call our office to schedule the follow-up appointment.  Any Other Special Instructions Will Be Listed Below (If Applicable).  If you need a refill on your cardiac medications before your next appointment, please call your pharmacy.

## 2018-02-12 NOTE — Progress Notes (Signed)
PCP: Manon Hilding, MD Primary Cardiologist: Dr Bronson Ing Primary EP: Dr Guadalupe Dawn is a 68 y.o. male who presents today for routine electrophysiology followup.  Since his afib ablation, the patient reports doing very well.   Denies procedure related complications.  Pleased with results.  Today, he denies symptoms of palpitations, chest pain, shortness of breath,  lower extremity edema, dizziness, presyncope, or syncope.  The patient is otherwise without complaint today.   Past Medical History:  Diagnosis Date  . Atrial fibrillation ([place])   . Dyslipidemia   . Ejection fraction    EF 55-60%, echo, May, 2014  . Heart murmur    As a Child  . Hypertension   . Overweight(278.02)   . Type II diabetes mellitus ([place])    Past Surgical History:  Procedure Laterality Date  . ATRIAL FIBRILLATION ABLATION N/A 03/02/2017   Procedure: ATRIAL FIBRILLATION ABLATION;  Surgeon: Thompson Grayer, MD;  Location: [facility];  Service: Cardiovascular;  Laterality: N/A;  . ATRIAL FIBRILLATION ABLATION N/A 10/13/2017   Procedure: ATRIAL FIBRILLATION ABLATION;  Surgeon: Thompson Grayer, MD;  Location: [facility];  Service: Cardiovascular;  Laterality: N/A;  . BACK SURGERY    . CARDIOVERSION N/A 08/11/2017   Procedure: CARDIOVERSION;  Surgeon: Fay Records, MD;  Location: [facility];  Service: Cardiovascular;  Laterality: N/A;  . DECOMPRESSIVE LUMBAR LAMINECTOMY LEVEL 2 Right 08/2009   L3-4; L4-5/notes 08/22/2009  . TEE WITHOUT CARDIOVERSION N/A 08/11/2017   Procedure: TRANSESOPHAGEAL ECHOCARDIOGRAM (TEE) WITH PROPOFOL;  Surgeon: Fay Records, MD;  Location: [facility];  Service: Cardiovascular;  Laterality: N/A;    ROS- all systems are reviewed and negatives except as per HPI above  Current Outpatient Medications  Medication Sig Dispense Refill  . acetaminophen (TYLENOL) 500 MG tablet Take 500 mg by mouth 3 (three) times daily as needed (for pain.).     Marland Kitchen amLODipine (NORVASC) 5 MG  tablet Take 1 tablet (5 mg total) by mouth daily. 30 tablet 11  . apixaban (ELIQUIS) 5 MG TABS tablet Take 1 tablet (5 mg total) by mouth 2 (two) times daily. 180 tablet 3  . atorvastatin (LIPITOR) 10 MG tablet Take 5 mg by mouth every Monday, Wednesday, and Friday.     . cetirizine (ZYRTEC) 10 MG tablet Take 5-10 mg by mouth daily as needed for allergies.     . cyclobenzaprine (FLEXERIL) 10 MG tablet Take 10 mg by mouth daily as needed for muscle spasms.    Marland Kitchen gabapentin (NEURONTIN) 300 MG capsule Take 300 mg by mouth at bedtime.    Marland Kitchen lisinopril-hydrochlorothiazide (PRINZIDE,ZESTORETIC) 20-12.5 MG per tablet Take 2 tablets by mouth daily.     . Melatonin 5 MG TABS Take 5 mg by mouth at bedtime.    . metFORMIN (GLUCOPHAGE-XR) 500 MG 24 hr tablet Take 1,000 mg by mouth 2 (two) times daily.   1  . pantoprazole (PROTONIX) 40 MG tablet Take 1 tablet (40 mg total) by mouth 2 (two) times daily. 90 tablet 3  . Polyethyl Glycol-Propyl Glycol (LUBRICANT EYE DROPS) 0.4-0.3 % SOLN Place 1 drop into both eyes 4 (four) times daily as needed (for dry eyes).     . ranitidine (ZANTAC) 150 MG tablet Take 150 mg by mouth 2 (two) times daily.    . metoprolol succinate (TOPROL-XL) 50 MG 24 hr tablet Take 1 tablet (50 mg total) by mouth daily. 60 tablet 3   No current facility-administered medications for this visit.  Physical Exam: Vitals:   02/12/18 1622  BP: (!) 152/84  Pulse: 75  SpO2: 96%  Weight: 218 lb 9.6 oz (99.2 kg)  Height: 6\' 1"  (1.854 m)    GEN- The patient is well appearing, alert and oriented x 3 today.   Head- normocephalic, atraumatic Eyes-  Sclera clear, conjunctiva pink Ears- hearing intact Oropharynx- clear Lungs- Clear to ausculation bilaterally, normal work of breathing Heart- Regular rate and rhythm, no murmurs, rubs or gallops, PMI not laterally displaced GI- soft, NT, ND, + BS Extremities- no clubbing, cyanosis, or edema  Wt Readings from Last 3 Encounters:  02/12/18 218  lb 9.6 oz (99.2 kg)  11/10/17 221 lb (100.2 kg)  10/13/17 222 lb 3.6 oz (100.8 kg)    EKG tracing ordered today is personally reviewed and shows sinus rhythm HR 80, PR 156, QRS 80, QTc 422  Assessment and Plan:  1. Paroxysmal atrial fibrillation/atrial flutter S/p ablation 02/2017 and 10/2017 Maintaining SR Continue Eliquis 5 mg BID  This patients CHA2DS2-VASc Score and unadjusted Ischemic Stroke Rate (% per year) is equal to 3.2 % stroke rate/year from a score of 3  Above score calculated as 1 point each if present [CHF, HTN, DM, Vascular=MI/PAD/Aortic Plaque, Age if 65-74, or Male] Above score calculated as 2 points each if present [Age > 75, or Stroke/TIA/TE]  2. HTN Stable, no changes today  3. Overweight Lifestyle change encouraged  4. GERD He takes daily protonix.  I have advised that he follow-up with Dr Quintin Alto for GI referral as he at times requires protonix BID.  This is not related to his ablation.  Follow up with Butch Penny in the AF clinic every 3 months I will see when needed  Thompson Grayer MD, [place] 02/12/2018 9:56 PM

## 2018-03-14 ENCOUNTER — Other Ambulatory Visit: Payer: Self-pay | Admitting: Internal Medicine

## 2018-03-14 MED ORDER — AMLODIPINE BESYLATE 5 MG PO TABS: 5.0000 mg | ORAL_TABLET | Freq: Every day | ORAL | 3 refills | Status: DC

## 2018-03-14 MED ORDER — APIXABAN 5 MG PO TABS: 5.0000 mg | ORAL_TABLET | Freq: Two times a day (BID) | ORAL | 1 refills | Status: DC

## 2018-03-14 NOTE — Telephone Encounter (Signed)
[facility] mail order pharmacy is requesting a refill on Eliquis. Please address

## 2018-03-14 NOTE — Telephone Encounter (Signed)
Age 68, weight 99.2kg, scr 1.16 on 09/13/17 Last OV 02/12/2018 Will send rx for apixaban 5mg  BID

## 2018-04-03 DIAGNOSIS — E1140 Type 2 diabetes mellitus with diabetic neuropathy, unspecified: Secondary | ICD-10-CM | POA: Diagnosis not present

## 2018-04-03 DIAGNOSIS — E1165 Type 2 diabetes mellitus with hyperglycemia: Secondary | ICD-10-CM | POA: Diagnosis not present

## 2018-04-03 DIAGNOSIS — I4891 Unspecified atrial fibrillation: Secondary | ICD-10-CM | POA: Diagnosis not present

## 2018-04-03 DIAGNOSIS — Z0001 Encounter for general adult medical examination with abnormal findings: Secondary | ICD-10-CM | POA: Diagnosis not present

## 2018-04-03 DIAGNOSIS — E782 Mixed hyperlipidemia: Secondary | ICD-10-CM | POA: Diagnosis not present

## 2018-04-03 DIAGNOSIS — E1129 Type 2 diabetes mellitus with other diabetic kidney complication: Secondary | ICD-10-CM | POA: Diagnosis not present

## 2018-04-03 DIAGNOSIS — Z6829 Body mass index (BMI) 29.0-29.9, adult: Secondary | ICD-10-CM | POA: Diagnosis not present

## 2018-04-03 DIAGNOSIS — I10 Essential (primary) hypertension: Secondary | ICD-10-CM | POA: Diagnosis not present

## 2018-04-20 ENCOUNTER — Other Ambulatory Visit: Payer: Self-pay

## 2018-04-20 MED ORDER — AMLODIPINE BESYLATE 10 MG PO TABS: 10.0000 mg | ORAL_TABLET | Freq: Every day | ORAL | 3 refills | Status: DC

## 2018-04-20 NOTE — Telephone Encounter (Signed)
See pt e-mail, per [email or web address], ok to increase amlodipine to 10 mg daily, e-scribed to mail order pharmacy, pt notified via mychart

## 2018-06-12 DIAGNOSIS — H16142 Punctate keratitis, left eye: Secondary | ICD-10-CM | POA: Diagnosis not present

## 2018-06-20 ENCOUNTER — Ambulatory Visit (HOSPITAL_COMMUNITY): Payer: Medicare Other | Admitting: Nurse Practitioner

## 2018-06-27 ENCOUNTER — Encounter (HOSPITAL_COMMUNITY): Payer: Self-pay | Admitting: Physician Assistant

## 2018-06-27 ENCOUNTER — Other Ambulatory Visit: Payer: Self-pay

## 2018-06-27 ENCOUNTER — Ambulatory Visit (HOSPITAL_COMMUNITY)
Admission: RE | Admit: 2018-06-27 | Discharge: 2018-06-27 | Disposition: A | Discharge status: Home / Self Care | Payer: Medicare Other | Source: Ambulatory Visit | Attending: Nurse Practitioner | Admitting: Nurse Practitioner

## 2018-06-27 VITALS — BP 130/72 | HR 75 | Ht 73.0 in | Wt 221.0 lb

## 2018-06-27 DIAGNOSIS — Z6829 Body mass index (BMI) 29.0-29.9, adult: Secondary | ICD-10-CM | POA: Diagnosis not present

## 2018-06-27 DIAGNOSIS — Z79899 Other long term (current) drug therapy: Secondary | ICD-10-CM | POA: Insufficient documentation

## 2018-06-27 DIAGNOSIS — Z7984 Long term (current) use of oral hypoglycemic drugs: Secondary | ICD-10-CM | POA: Diagnosis not present

## 2018-06-27 DIAGNOSIS — I340 Nonrheumatic mitral (valve) insufficiency: Secondary | ICD-10-CM | POA: Diagnosis not present

## 2018-06-27 DIAGNOSIS — E663 Overweight: Secondary | ICD-10-CM | POA: Insufficient documentation

## 2018-06-27 DIAGNOSIS — Z7901 Long term (current) use of anticoagulants: Secondary | ICD-10-CM | POA: Insufficient documentation

## 2018-06-27 DIAGNOSIS — Z8249 Family history of ischemic heart disease and other diseases of the circulatory system: Secondary | ICD-10-CM | POA: Diagnosis not present

## 2018-06-27 DIAGNOSIS — E119 Type 2 diabetes mellitus without complications: Secondary | ICD-10-CM | POA: Insufficient documentation

## 2018-06-27 DIAGNOSIS — I480 Paroxysmal atrial fibrillation: Secondary | ICD-10-CM | POA: Diagnosis not present

## 2018-06-27 DIAGNOSIS — I10 Essential (primary) hypertension: Secondary | ICD-10-CM | POA: Insufficient documentation

## 2018-06-27 DIAGNOSIS — I4892 Unspecified atrial flutter: Secondary | ICD-10-CM | POA: Diagnosis not present

## 2018-06-27 DIAGNOSIS — R609 Edema, unspecified: Secondary | ICD-10-CM | POA: Insufficient documentation

## 2018-06-27 DIAGNOSIS — E785 Hyperlipidemia, unspecified: Secondary | ICD-10-CM | POA: Insufficient documentation

## 2018-06-27 NOTE — Progress Notes (Signed)
Primary Care Physician: Manon Hilding, MD Primary Cardiologist: Dr Bronson Ing Primary Electrophysiologist: Dr Rayann Heman Referring Physician: Dr Guadalupe Dawn is a 68 y.o. male with a history of paroxysmal atrial fibrillation and atrial flutter who presents for follow up in the [facility]. Patient reports that he has done well since his last visit with no symptoms of heart racing or palpitations. He remains very active by walking daily and doing yard work. He does note some increased edema R>L since increasing Norvasc. No claudication symptoms.   Today, he denies symptoms of palpitations, chest pain, shortness of breath, orthopnea, PND, dizziness, presyncope, syncope, snoring, daytime somnolence, bleeding, or neurologic sequela. The patient is tolerating medications without difficulties and is otherwise without complaint today.    Atrial Fibrillation Risk Factors:  he does not have symptoms or diagnosis of sleep apnea. he does not have a history of alcohol use. The patient does not have a history of early familial atrial fibrillation or other arrhythmias.  he has a BMI of Body mass index is 29.16 kg/m.Marland Kitchen Filed Weights   06/27/18 1031  Weight: 100.2 kg    Family History  Problem Relation Age of Onset  . Heart failure Mother   . Stroke Father      Atrial Fibrillation Management history:  Previous antiarrhythmic drugs: flecainide Previous cardioversions: 08/11/17 Previous ablations: 02/2017, 10/2017 CHADS2VASC score: 3 Anticoagulation history: Eliquis    Past Medical History:  Diagnosis Date  . Atrial fibrillation ([place])   . Dyslipidemia   . Ejection fraction    EF 55-60%, echo, May, 2014  . Heart murmur    As a Child  . Hypertension   . Overweight(278.02)   . Type II diabetes mellitus ([place])    Past Surgical History:  Procedure Laterality Date  . ATRIAL FIBRILLATION ABLATION N/A 03/02/2017   Procedure: ATRIAL FIBRILLATION ABLATION;   Surgeon: Thompson Grayer, MD;  Location: [place];  Service: Cardiovascular;  Laterality: N/A;  . ATRIAL FIBRILLATION ABLATION N/A 10/13/2017   Procedure: ATRIAL FIBRILLATION ABLATION;  Surgeon: Thompson Grayer, MD;  Location: [place] [facility];  Service: Cardiovascular;  Laterality: N/A;  . BACK SURGERY    . CARDIOVERSION N/A 08/11/2017   Procedure: CARDIOVERSION;  Surgeon: Fay Records, MD;  Location: [facility];  Service: Cardiovascular;  Laterality: N/A;  . DECOMPRESSIVE LUMBAR LAMINECTOMY LEVEL 2 Right 08/2009   L3-4; L4-5/notes 08/22/2009  . TEE WITHOUT CARDIOVERSION N/A 08/11/2017   Procedure: TRANSESOPHAGEAL ECHOCARDIOGRAM (TEE) WITH PROPOFOL;  Surgeon: Fay Records, MD;  Location: [facility];  Service: Cardiovascular;  Laterality: N/A;    Current Outpatient Medications  Medication Sig Dispense Refill  . acetaminophen (TYLENOL) 500 MG tablet Take 500 mg by mouth 3 (three) times daily as needed (for pain.).     Marland Kitchen amLODipine (NORVASC) 10 MG tablet Take 1 tablet (10 mg total) by mouth daily. 90 tablet 3  . apixaban (ELIQUIS) 5 MG TABS tablet Take 1 tablet (5 mg total) by mouth 2 (two) times daily. 180 tablet 1  . atorvastatin (LIPITOR) 10 MG tablet Take 5 mg by mouth every Monday, Wednesday, and Friday.     . cetirizine (ZYRTEC) 10 MG tablet Take 5-10 mg by mouth daily as needed for allergies.     Marland Kitchen gabapentin (NEURONTIN) 300 MG capsule Take 600 mg by mouth at bedtime.     Marland Kitchen lisinopril-hydrochlorothiazide (PRINZIDE,ZESTORETIC) 20-12.5 MG per tablet Take 2 tablets by mouth daily.     Marland Kitchen  Melatonin 5 MG TABS Take 5 mg by mouth at bedtime.    . metFORMIN (GLUCOPHAGE-XR) 500 MG 24 hr tablet Take 1,000 mg by mouth 2 (two) times daily.   1  . metoprolol succinate (TOPROL-XL) 50 MG 24 hr tablet Take 1 tablet (50 mg total) by mouth daily. 60 tablet 3  . pantoprazole (PROTONIX) 40 MG tablet Take 40 mg by mouth daily.    . cyclobenzaprine (FLEXERIL) 10 MG tablet Take 10 mg by mouth daily as needed for  muscle spasms.     No current facility-administered medications for this encounter.     No Known Allergies  Social History   Socioeconomic History  . Marital status: Married    Spouse name: Not on file  . Number of children: Not on file  . Years of education: Not on file  . Highest education level: Not on file  Occupational History  . Not on file  Social Needs  . Financial resource strain: Not on file  . Food insecurity    Worry: Not on file    Inability: Not on file  . Transportation needs    Medical: Not on file    Non-medical: Not on file  Tobacco Use  . Smoking status: Never Smoker  . Smokeless tobacco: Never Used  Substance and Sexual Activity  . Alcohol use: No    Alcohol/week: 0.0 standard drinks    Frequency: Never  . Drug use: No  . Sexual activity: Not Currently  Lifestyle  . Physical activity    Days per week: Not on file    Minutes per session: Not on file  . Stress: Not on file  Relationships  . Social Herbalist on phone: Patient refused    Gets together: Patient refused    Attends religious service: Patient refused    Active member of club or organization: Patient refused    Attends meetings of clubs or organizations: Patient refused    Relationship status: Patient refused  . Intimate partner violence    Fear of current or ex partner: Patient refused    Emotionally abused: Patient refused    Physically abused: Patient refused    Forced sexual activity: Patient refused  Other Topics Concern  . Not on file  Social History Narrative  . Not on file     ROS- All systems are reviewed and negative except as per the HPI above.  Physical Exam: Vitals:   06/27/18 1031  BP: 130/72  Pulse: 75  Weight: 100.2 kg  Height: 6\' 1"  (1.854 m)    GEN- The patient is well appearing, alert and oriented x 3 today.   Head- normocephalic, atraumatic Eyes-  Sclera clear, conjunctiva pink Ears- hearing intact Oropharynx- clear Neck- supple   Lungs- Clear to ausculation bilaterally, normal work of breathing Heart- Regular rate and rhythm, no murmurs, rubs or gallops  GI- soft, NT, ND, + BS Extremities- no clubbing, cyanosis. 1+ edema R, trace edema L. MS- no significant deformity or atrophy Skin- no rash or lesion Psych- euthymic mood, full affect Neuro- strength and sensation are intact  Wt Readings from Last 3 Encounters:  06/27/18 100.2 kg  02/12/18 99.2 kg  11/10/17 100.2 kg    EKG today demonstrates SR HR 75, PR 172, QRS 78, QTc 402  Echo 06/23/16 demonstrated  - Left ventricle: The cavity size was normal. Wall thickness was   increased in a pattern of mild LVH. Systolic function was normal.   The estimated  ejection fraction was in the range of 60% to 65%.   Left ventricular diastolic function parameters were normal. - Aortic valve: AV is thickened with minimally restricted motion. - Mitral valve: There was mild regurgitation.  Epic records are reviewed at length today  Assessment and Plan:  1. Paroxysmal atrial fibrillation/atrial flutter S/p ablations 02/2017 and 10/2017. Doing well with no symptoms, appears to be maintaining SR Continue Eliquis 5 mg BID Continue Toprol 50 mg daily  This patients CHA2DS2-VASc Score and unadjusted Ischemic Stroke Rate (% per year) is equal to 3.2 % stroke rate/year from a score of 3  Above score calculated as 1 point each if present [CHF, HTN, DM, Vascular=MI/PAD/Aortic Plaque, Age if 65-74, or Male] Above score calculated as 2 points each if present [Age > 75, or Stroke/TIA/TE]   2. Overweight Body mass index is 29.16 kg/m. Lifestyle modification was discussed at length including regular exercise and weight reduction. Patient doing well.  3. HTN Stable, no changes today. If edema becomes intolerable, may need to change CCB.  4. Edema CCB vs venous insufficiency. No pain. He is on full dose Eliquis. No changes today.   Follow up with AF clinic in 3-4 months. He  lives in [place] and would like alternating MyChart virtual visits and in office visits.    [facility] [address] [place], [place] [address] [phone] 06/27/2018 10:42 AM

## 2018-07-26 DIAGNOSIS — E782 Mixed hyperlipidemia: Secondary | ICD-10-CM | POA: Diagnosis not present

## 2018-07-26 DIAGNOSIS — E1165 Type 2 diabetes mellitus with hyperglycemia: Secondary | ICD-10-CM | POA: Diagnosis not present

## 2018-07-26 DIAGNOSIS — N189 Chronic kidney disease, unspecified: Secondary | ICD-10-CM | POA: Diagnosis not present

## 2018-07-26 DIAGNOSIS — I10 Essential (primary) hypertension: Secondary | ICD-10-CM | POA: Diagnosis not present

## 2018-08-01 DIAGNOSIS — E782 Mixed hyperlipidemia: Secondary | ICD-10-CM | POA: Diagnosis not present

## 2018-08-01 DIAGNOSIS — E1165 Type 2 diabetes mellitus with hyperglycemia: Secondary | ICD-10-CM | POA: Diagnosis not present

## 2018-08-01 DIAGNOSIS — I4891 Unspecified atrial fibrillation: Secondary | ICD-10-CM | POA: Diagnosis not present

## 2018-08-01 DIAGNOSIS — Z6828 Body mass index (BMI) 28.0-28.9, adult: Secondary | ICD-10-CM | POA: Diagnosis not present

## 2018-08-01 DIAGNOSIS — R800 Isolated proteinuria: Secondary | ICD-10-CM | POA: Diagnosis not present

## 2018-08-01 DIAGNOSIS — F5101 Primary insomnia: Secondary | ICD-10-CM | POA: Diagnosis not present

## 2018-08-01 DIAGNOSIS — E1129 Type 2 diabetes mellitus with other diabetic kidney complication: Secondary | ICD-10-CM | POA: Diagnosis not present

## 2018-08-01 DIAGNOSIS — E1140 Type 2 diabetes mellitus with diabetic neuropathy, unspecified: Secondary | ICD-10-CM | POA: Diagnosis not present

## 2018-08-02 MED ORDER — METOPROLOL SUCCINATE ER 50 MG PO TB24: 50.0000 mg | ORAL_TABLET | Freq: Every day | ORAL | 2 refills | Status: DC

## 2018-08-02 NOTE — Telephone Encounter (Signed)
Pt's medication was sent to pt's pharmacy as requested. Confirmation received.  °

## 2018-09-11 DIAGNOSIS — H2513 Age-related nuclear cataract, bilateral: Secondary | ICD-10-CM | POA: Diagnosis not present

## 2018-09-26 MED ORDER — APIXABAN 5 MG PO TABS: 5.0000 mg | ORAL_TABLET | Freq: Two times a day (BID) | ORAL | 3 refills | Status: AC

## 2018-10-08 DIAGNOSIS — Z23 Encounter for immunization: Secondary | ICD-10-CM | POA: Diagnosis not present

## 2018-10-10 DIAGNOSIS — H25013 Cortical age-related cataract, bilateral: Secondary | ICD-10-CM | POA: Diagnosis not present

## 2018-10-10 DIAGNOSIS — H2513 Age-related nuclear cataract, bilateral: Secondary | ICD-10-CM | POA: Diagnosis not present

## 2018-10-10 DIAGNOSIS — H35033 Hypertensive retinopathy, bilateral: Secondary | ICD-10-CM | POA: Diagnosis not present

## 2018-10-10 DIAGNOSIS — H2512 Age-related nuclear cataract, left eye: Secondary | ICD-10-CM | POA: Diagnosis not present

## 2018-10-10 DIAGNOSIS — H35363 Drusen (degenerative) of macula, bilateral: Secondary | ICD-10-CM | POA: Diagnosis not present

## 2018-10-10 DIAGNOSIS — H40023 Open angle with borderline findings, high risk, bilateral: Secondary | ICD-10-CM | POA: Diagnosis not present

## 2018-10-23 DIAGNOSIS — H25812 Combined forms of age-related cataract, left eye: Secondary | ICD-10-CM | POA: Diagnosis not present

## 2018-10-23 DIAGNOSIS — H2512 Age-related nuclear cataract, left eye: Secondary | ICD-10-CM | POA: Diagnosis not present

## 2018-10-25 ENCOUNTER — Telehealth (HOSPITAL_COMMUNITY): Payer: Medicare Other | Admitting: Physician Assistant

## 2018-10-30 ENCOUNTER — Other Ambulatory Visit: Payer: Self-pay | Admitting: Internal Medicine

## 2018-10-30 ENCOUNTER — Other Ambulatory Visit: Payer: Self-pay | Admitting: Cardiovascular Disease

## 2018-11-01 ENCOUNTER — Ambulatory Visit (HOSPITAL_COMMUNITY): Payer: Medicare Other | Admitting: Nurse Practitioner

## 2018-11-06 ENCOUNTER — Ambulatory Visit (HOSPITAL_COMMUNITY): Payer: Medicare Other | Admitting: Nurse Practitioner

## 2018-11-07 ENCOUNTER — Other Ambulatory Visit: Payer: Self-pay

## 2018-11-07 ENCOUNTER — Encounter (HOSPITAL_COMMUNITY): Payer: Self-pay | Admitting: Nurse Practitioner

## 2018-11-07 ENCOUNTER — Ambulatory Visit (HOSPITAL_COMMUNITY)
Admission: RE | Admit: 2018-11-07 | Discharge: 2018-11-07 | Disposition: A | Discharge status: Home / Self Care | Payer: Medicare Other | Source: Ambulatory Visit | Attending: Physician Assistant | Admitting: Physician Assistant

## 2018-11-07 VITALS — BP 166/86 | HR 78 | Ht 73.0 in | Wt 221.0 lb

## 2018-11-07 DIAGNOSIS — Z79899 Other long term (current) drug therapy: Secondary | ICD-10-CM | POA: Diagnosis not present

## 2018-11-07 DIAGNOSIS — Z823 Family history of stroke: Secondary | ICD-10-CM | POA: Diagnosis not present

## 2018-11-07 DIAGNOSIS — R609 Edema, unspecified: Secondary | ICD-10-CM | POA: Diagnosis not present

## 2018-11-07 DIAGNOSIS — E119 Type 2 diabetes mellitus without complications: Secondary | ICD-10-CM | POA: Diagnosis not present

## 2018-11-07 DIAGNOSIS — Z6829 Body mass index (BMI) 29.0-29.9, adult: Secondary | ICD-10-CM | POA: Insufficient documentation

## 2018-11-07 DIAGNOSIS — Z8249 Family history of ischemic heart disease and other diseases of the circulatory system: Secondary | ICD-10-CM | POA: Diagnosis not present

## 2018-11-07 DIAGNOSIS — Z7901 Long term (current) use of anticoagulants: Secondary | ICD-10-CM | POA: Diagnosis not present

## 2018-11-07 DIAGNOSIS — E785 Hyperlipidemia, unspecified: Secondary | ICD-10-CM | POA: Diagnosis not present

## 2018-11-07 DIAGNOSIS — H2511 Age-related nuclear cataract, right eye: Secondary | ICD-10-CM | POA: Diagnosis not present

## 2018-11-07 DIAGNOSIS — Z7952 Long term (current) use of systemic steroids: Secondary | ICD-10-CM | POA: Insufficient documentation

## 2018-11-07 DIAGNOSIS — I10 Essential (primary) hypertension: Secondary | ICD-10-CM | POA: Diagnosis not present

## 2018-11-07 DIAGNOSIS — E663 Overweight: Secondary | ICD-10-CM | POA: Insufficient documentation

## 2018-11-07 DIAGNOSIS — H25011 Cortical age-related cataract, right eye: Secondary | ICD-10-CM | POA: Diagnosis not present

## 2018-11-07 DIAGNOSIS — Z7984 Long term (current) use of oral hypoglycemic drugs: Secondary | ICD-10-CM | POA: Insufficient documentation

## 2018-11-07 DIAGNOSIS — I480 Paroxysmal atrial fibrillation: Secondary | ICD-10-CM | POA: Insufficient documentation

## 2018-11-07 DIAGNOSIS — I4892 Unspecified atrial flutter: Secondary | ICD-10-CM | POA: Diagnosis not present

## 2018-11-07 NOTE — Progress Notes (Signed)
Primary Care Physician: Manon Hilding, MD Primary Cardiologist: Dr Bronson Ing Primary Electrophysiologist: Dr Rayann Heman Referring Physician: Dr Guadalupe Dawn is a 68 y.o. male with a history of paroxysmal atrial fibrillation and atrial flutter who presents for follow up in the [facility]. Patient reports that he has done well since his ablation one year ago with no symptoms of heart racing or palpitations. He remains very active by walking daily and doing yard work. He does note some increased edema   since increasing Norvasc. No claudication symptoms. He does have neuropathy.  BP elevated this am, did not take am ace/hctz as he travels form [place] and did not want to stop to use the BR. On recheck, 142/84.  Today, he denies symptoms of palpitations, chest pain, shortness of breath, orthopnea, PND, dizziness, presyncope, syncope, snoring, daytime somnolence, bleeding, or neurologic sequela. The patient is tolerating medications without difficulties and is otherwise without complaint today.    Atrial Fibrillation Risk Factors:  he does not have symptoms or diagnosis of sleep apnea. he does not have a history of alcohol use. The patient does not have a history of early familial atrial fibrillation or other arrhythmias.  he has a BMI of Body mass index is 29.16 kg/m.Marland Kitchen Filed Weights   11/07/18 0904  Weight: 100.2 kg    Family History  Problem Relation Age of Onset  . Heart failure Mother   . Stroke Father      Atrial Fibrillation Management history:  Previous antiarrhythmic drugs: flecainide Previous cardioversions: 08/11/17 Previous ablations: 02/2017, 10/2017 CHADS2VASC score: 3 Anticoagulation history: Eliquis    Past Medical History:  Diagnosis Date  . Atrial fibrillation ([place])   . Dyslipidemia   . Ejection fraction    EF 55-60%, echo, May, 2014  . Heart murmur    As a Child  . Hypertension   . Overweight(278.02)   . Type II  diabetes mellitus ([place])    Past Surgical History:  Procedure Laterality Date  . ATRIAL FIBRILLATION ABLATION N/A 03/02/2017   Procedure: ATRIAL FIBRILLATION ABLATION;  Surgeon: Thompson Grayer, MD;  Location: [place] [facility];  Service: Cardiovascular;  Laterality: N/A;  . ATRIAL FIBRILLATION ABLATION N/A 10/13/2017   Procedure: ATRIAL FIBRILLATION ABLATION;  Surgeon: Thompson Grayer, MD;  Location: [facility];  Service: Cardiovascular;  Laterality: N/A;  . BACK SURGERY    . CARDIOVERSION N/A 08/11/2017   Procedure: CARDIOVERSION;  Surgeon: Fay Records, MD;  Location: [facility];  Service: Cardiovascular;  Laterality: N/A;  . DECOMPRESSIVE LUMBAR LAMINECTOMY LEVEL 2 Right 08/2009   L3-4; L4-5/notes 08/22/2009  . TEE WITHOUT CARDIOVERSION N/A 08/11/2017   Procedure: TRANSESOPHAGEAL ECHOCARDIOGRAM (TEE) WITH PROPOFOL;  Surgeon: Fay Records, MD;  Location: [facility];  Service: Cardiovascular;  Laterality: N/A;    Current Outpatient Medications  Medication Sig Dispense Refill  . acetaminophen (TYLENOL) 500 MG tablet Take 500 mg by mouth daily. Takes one tablet at bedtime    . amLODipine (NORVASC) 10 MG tablet TAKE 1 TABLET DAILY 90 tablet 1  . apixaban (ELIQUIS) 5 MG TABS tablet Take 1 tablet (5 mg total) by mouth 2 (two) times daily. 180 tablet 3  . atorvastatin (LIPITOR) 10 MG tablet Take 5 mg by mouth every Monday, Wednesday, and Friday.     . cetirizine (ZYRTEC) 10 MG tablet Take 5-10 mg by mouth daily as needed for allergies.     . cyclobenzaprine (FLEXERIL) 10 MG tablet Take 10  mg by mouth daily as needed for muscle spasms.    Marland Kitchen gabapentin (NEURONTIN) 300 MG capsule Take 600 mg by mouth at bedtime.     Marland Kitchen ketorolac (ACULAR) 0.4 % SOLN Place 1 drop into the left eye 4 (four) times daily.    Marland Kitchen lisinopril-hydrochlorothiazide (PRINZIDE,ZESTORETIC) 20-12.5 MG per tablet Take 2 tablets by mouth daily.     . Melatonin 5 MG TABS Take 5 mg by mouth at bedtime.    . metFORMIN (GLUCOPHAGE-XR) 500 MG  24 hr tablet Take 1,000 mg by mouth 2 (two) times daily.   1  . metoprolol succinate (TOPROL-XL) 50 MG 24 hr tablet Take 1 tablet (50 mg total) by mouth daily. 90 tablet 2  . ofloxacin (OCUFLOX) 0.3 % ophthalmic solution 1 drop 4 (four) times daily.    . pantoprazole (PROTONIX) 40 MG tablet Take 40 mg by mouth daily.    . prednisoLONE acetate (PRED FORTE) 1 % ophthalmic suspension Place 1 drop into the left eye 4 (four) times daily.     No current facility-administered medications for this encounter.     No Known Allergies  Social History   Socioeconomic History  . Marital status: Married    Spouse name: Not on file  . Number of children: Not on file  . Years of education: Not on file  . Highest education level: Not on file  Occupational History  . Not on file  Social Needs  . Financial resource strain: Not on file  . Food insecurity    Worry: Not on file    Inability: Not on file  . Transportation needs    Medical: Not on file    Non-medical: Not on file  Tobacco Use  . Smoking status: Never Smoker  . Smokeless tobacco: Never Used  Substance and Sexual Activity  . Alcohol use: No    Alcohol/week: 0.0 standard drinks    Frequency: Never  . Drug use: No  . Sexual activity: Not Currently  Lifestyle  . Physical activity    Days per week: Not on file    Minutes per session: Not on file  . Stress: Not on file  Relationships  . Social Herbalist on phone: Patient refused    Gets together: Patient refused    Attends religious service: Patient refused    Active member of club or organization: Patient refused    Attends meetings of clubs or organizations: Patient refused    Relationship status: Patient refused  . Intimate partner violence    Fear of current or ex partner: Patient refused    Emotionally abused: Patient refused    Physically abused: Patient refused    Forced sexual activity: Patient refused  Other Topics Concern  . Not on file  Social History  Narrative  . Not on file     ROS- All systems are reviewed and negative except as per the HPI above.  Physical Exam: Vitals:   11/07/18 0904  BP: (!) 166/86  Pulse: 78  Weight: 100.2 kg  Height: 6\' 1"  (1.854 m)    GEN- The patient is well appearing, alert and oriented x 3 today.   Head- normocephalic, atraumatic Eyes-  Sclera clear, conjunctiva pink Ears- hearing intact Oropharynx- clear Neck- supple  Lungs- Clear to ausculation bilaterally, normal work of breathing Heart- Regular rate and rhythm, no murmurs, rubs or gallops  GI- soft, NT, ND, + BS Extremities- no clubbing, cyanosis. 1+ edema R, trace edema L. MS- no  significant deformity or atrophy Skin- no rash or lesion Psych- euthymic mood, full affect Neuro- strength and sensation are intact  Wt Readings from Last 3 Encounters:  11/07/18 100.2 kg  06/27/18 100.2 kg  02/12/18 99.2 kg    EKG today demonstrates SR HR 78 , PR 172, QRS 82, QTc 412 ms  Echo 06/23/16 demonstrated  - Left ventricle: The cavity size was normal. Wall thickness was   increased in a pattern of mild LVH. Systolic function was normal.   The estimated ejection fraction was in the range of 60% to 65%.   Left ventricular diastolic function parameters were normal. - Aortic valve: AV is thickened with minimally restricted motion. - Mitral valve: There was mild regurgitation.  Epic records are reviewed at length today  Assessment and Plan:  1. Paroxysmal atrial fibrillation/atrial flutter S/p ablations 02/2017 and 10/2017. Doing well with no symptoms, appears to be maintaining SR Continue Eliquis 5 mg BID Continue Toprol 50 mg daily  This patients CHA2DS2-VASc Score and unadjusted Ischemic Stroke Rate (% per year) is equal to 3.2 % stroke rate/year from a score of 3  Above score calculated as 1 point each if present [CHF, HTN, DM, Vascular=MI/PAD/Aortic Plaque, Age if 65-74, or Male] Above score calculated as 2 points each if present [Age  > 75, or Stroke/TIA/TE]  2. Overweight Body mass index is 29.16 kg/m. Lifestyle modification was discussed at length including regular exercise and weight reduction. Patient doing well.  3. HTN Stable, did not take lisinopril/hct because he did not want to stop for a bathroom break during travel from [place] Suggested we break out hctz from  lisinopril and then he  would have more control taking the fluid pill when it was more convenient with travel, he will let me know when he is finishing current rx  If edema becomes intolerable, may need to change CCB. Knee high compression socks recommended  4. Edema CCB vs venous insufficiency. No pain. He is on full dose Eliquis. No changes today. hctz   Follow up with Dr. Rayann Heman in [place] in 6 months  afib clinic as needed   [facility] [address] [place], [place] [address] [phone] 11/07/2018 9:37 AM

## 2018-11-20 DIAGNOSIS — H2511 Age-related nuclear cataract, right eye: Secondary | ICD-10-CM | POA: Diagnosis not present

## 2018-11-20 DIAGNOSIS — H25811 Combined forms of age-related cataract, right eye: Secondary | ICD-10-CM | POA: Diagnosis not present

## 2018-11-20 DIAGNOSIS — H25011 Cortical age-related cataract, right eye: Secondary | ICD-10-CM | POA: Diagnosis not present

## 2018-11-25 ENCOUNTER — Other Ambulatory Visit: Payer: Self-pay | Admitting: Internal Medicine

## 2018-12-03 DIAGNOSIS — I4891 Unspecified atrial fibrillation: Secondary | ICD-10-CM | POA: Diagnosis not present

## 2018-12-03 DIAGNOSIS — E1129 Type 2 diabetes mellitus with other diabetic kidney complication: Secondary | ICD-10-CM | POA: Diagnosis not present

## 2018-12-03 DIAGNOSIS — G629 Polyneuropathy, unspecified: Secondary | ICD-10-CM | POA: Diagnosis not present

## 2018-12-03 DIAGNOSIS — Z6828 Body mass index (BMI) 28.0-28.9, adult: Secondary | ICD-10-CM | POA: Diagnosis not present

## 2018-12-03 DIAGNOSIS — E1165 Type 2 diabetes mellitus with hyperglycemia: Secondary | ICD-10-CM | POA: Diagnosis not present

## 2018-12-03 DIAGNOSIS — E1140 Type 2 diabetes mellitus with diabetic neuropathy, unspecified: Secondary | ICD-10-CM | POA: Diagnosis not present

## 2018-12-03 DIAGNOSIS — R800 Isolated proteinuria: Secondary | ICD-10-CM | POA: Diagnosis not present

## 2018-12-03 DIAGNOSIS — E782 Mixed hyperlipidemia: Secondary | ICD-10-CM | POA: Diagnosis not present

## 2018-12-03 IMAGING — CT CT HEART MORPH/PULM VEIN W/ CM & W/O CA SCORE
2 of 9 series · 6 of 20 positions shown, 7 images · IV contrast (APPLIED)
Comparison: 02/21/2017
COMPARISON: 02/21/17

EXAM:
OVER-READ INTERPRETATION  CT CHEST

The following report is an over-read performed by radiologist Dr.
does not include interpretation of cardiac or coronary anatomy or
pathology. The cardiac/coronary CT interpretation by the
cardiologist is attached.
CLINICAL DATA: Pre Ablation
Cardiac Gated CTA
TECHNIQUE: The patient was scanned on a Siemens Force [REDACTED]ice scanner. Gantry
rotation speed was 250 msec with a temporal resolution of 66 msec. A
prospective scan was triggered in the ascending thoracic aorta at
140 HU's Data sets were reconstructed with full mA between 35% and
75% of the R-R interval Images were reviewed using VRT, MIP and MPR
modes. Double oblique images were used to measure the PV diameter
and areas. The patient received 80 cc of contrast at 5 cc/sec
CONTRAST:  Isovue 370 total 80 cc

[Series 9: best diast · axial · 0.35mm/px · z∈[+1194,+1246]mm · 3 of 265 slices shown, 4 images]
[im 67/265  vessel]
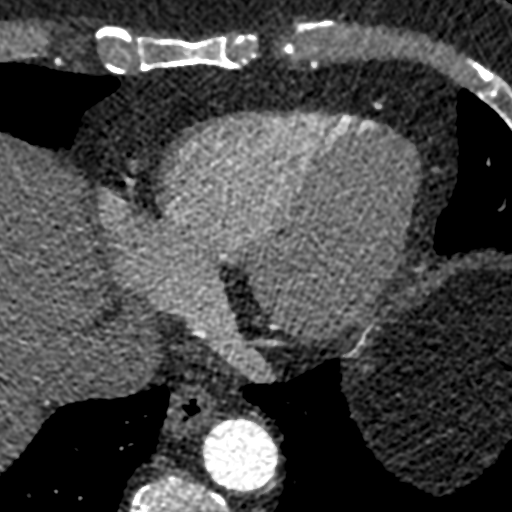
[im 67/265  lung]
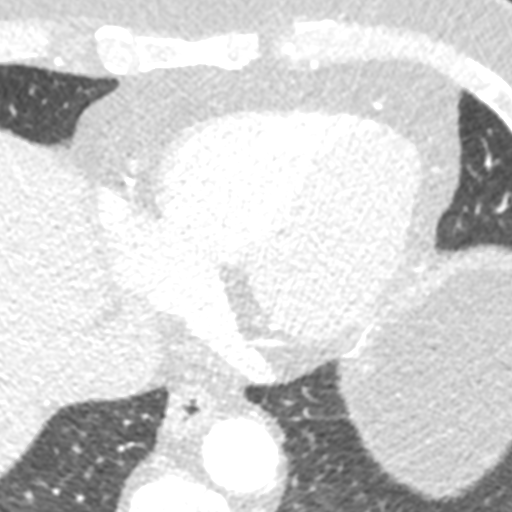
[im 133/265  vessel]
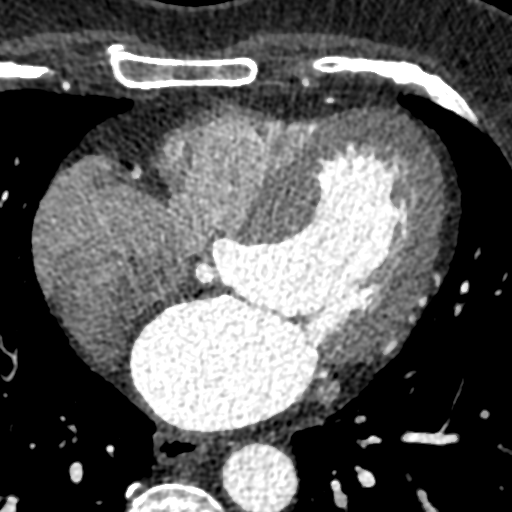
[im 199/265  vessel]
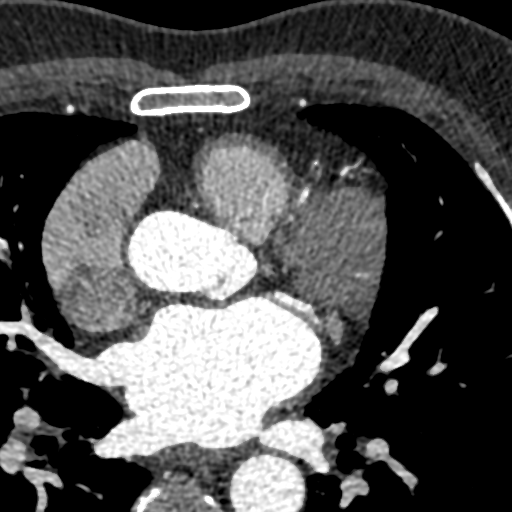

[Series 10: +300 ms · axial · 0.35mm/px · z∈[+1194,+1246]mm · 3 of 265 slices shown]
[im 67/265  vessel]
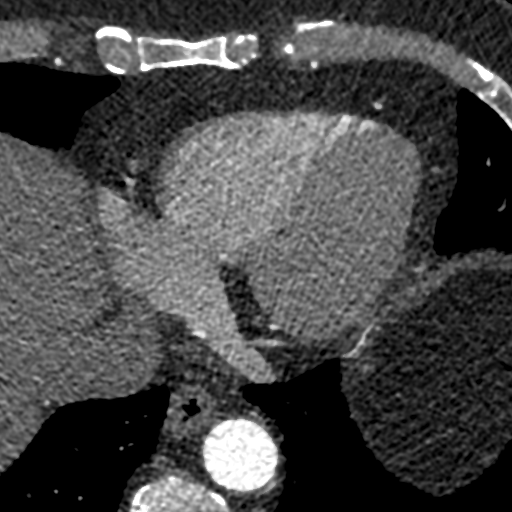
[im 133/265  vessel]
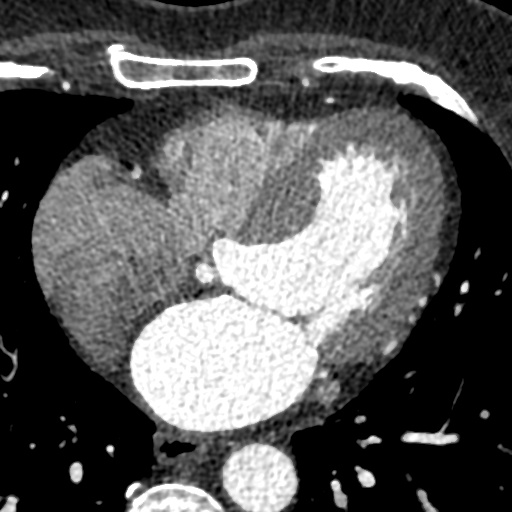
[im 199/265  vessel]
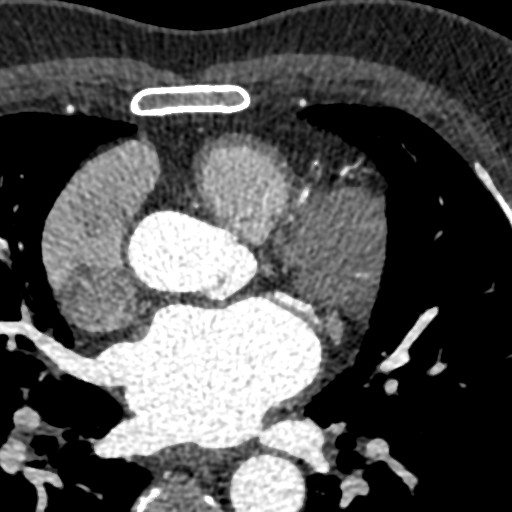

[6 of 20 positions shown; findings below may reference images not displayed]

FINDINGS: Compared to prior exam on 02/21/2017, the largest previously seen
nodule in the posterior right lower lobe measuring 7 mm is no longer
visualized. Other scattered less than 5 mm pulmonary nodules in both
lower lobes show no significant change. No new or enlarging
pulmonary nodules seen in the visualized portions of the lungs. No
evidence of pleural effusion.
IMPRESSION: Resolution of previously seen 7 mm right lower lobe pulmonary
nodule. Tiny less than 5 mm bilateral lower lobe pulmonary nodules
remain stable, likely post inflammatory in etiology. No further
imaging follow-up required. This recommendation follows the
consensus statement: Guidelines for Management of Small Pulmonary
Nodules Detected on CT Images: From the [HOSPITAL] 1879;
FINDINGS: Moderate biatrial enlargement. No RUDI thrombus. Normal aortic root
3.3 cm. Normal PV anatomy with early bifurcation of both upper PV;s
and RLPV. No ASD/PFO.

LUPV: Ostium 12.7 mm    area 1.4 cm2

LLPV:   Ostium 11.1 mm   area 1.1 cm2

RUPV:  Ostium 17.1 mm  area 1.8 cm2

RLPV:  Ostium 20.6 mm  area 3.2 cm2
IMPRESSION: 1.  No RUDI thrombus

2. Normal PV anatomy with no stenosis Early bifurcation of right
sided veins

3.  Moderate bi atrial enlargement

4.  No pericardial effusion

5.  Esophagus courses closest to the RLPV ostium

6.  Normal aortic root 3.3 cm

7. Calcium score 496 involving all 3 major epicardial arteries which
is 81 st percentile for age and sex

## 2019-01-16 DIAGNOSIS — Z20828 Contact with and (suspected) exposure to other viral communicable diseases: Secondary | ICD-10-CM | POA: Diagnosis not present

## 2019-03-19 DIAGNOSIS — E782 Mixed hyperlipidemia: Secondary | ICD-10-CM | POA: Diagnosis not present

## 2019-03-19 DIAGNOSIS — I10 Essential (primary) hypertension: Secondary | ICD-10-CM | POA: Diagnosis not present

## 2019-03-19 DIAGNOSIS — E1022 Type 1 diabetes mellitus with diabetic chronic kidney disease: Secondary | ICD-10-CM | POA: Diagnosis not present

## 2019-03-19 DIAGNOSIS — N189 Chronic kidney disease, unspecified: Secondary | ICD-10-CM | POA: Diagnosis not present

## 2019-03-21 ENCOUNTER — Other Ambulatory Visit: Payer: Self-pay | Admitting: Internal Medicine

## 2019-03-21 ENCOUNTER — Other Ambulatory Visit: Payer: Self-pay | Admitting: Physician Assistant

## 2019-03-31 ENCOUNTER — Other Ambulatory Visit: Payer: Self-pay | Admitting: Physician Assistant

## 2019-04-04 DIAGNOSIS — Z6828 Body mass index (BMI) 28.0-28.9, adult: Secondary | ICD-10-CM | POA: Diagnosis not present

## 2019-04-04 DIAGNOSIS — R5383 Other fatigue: Secondary | ICD-10-CM | POA: Diagnosis not present

## 2019-04-04 DIAGNOSIS — E1165 Type 2 diabetes mellitus with hyperglycemia: Secondary | ICD-10-CM | POA: Diagnosis not present

## 2019-04-04 DIAGNOSIS — E1129 Type 2 diabetes mellitus with other diabetic kidney complication: Secondary | ICD-10-CM | POA: Diagnosis not present

## 2019-04-04 DIAGNOSIS — Z0001 Encounter for general adult medical examination with abnormal findings: Secondary | ICD-10-CM | POA: Diagnosis not present

## 2019-04-04 DIAGNOSIS — E1140 Type 2 diabetes mellitus with diabetic neuropathy, unspecified: Secondary | ICD-10-CM | POA: Diagnosis not present

## 2019-04-04 DIAGNOSIS — R800 Isolated proteinuria: Secondary | ICD-10-CM | POA: Diagnosis not present

## 2019-04-10 DIAGNOSIS — Z23 Encounter for immunization: Secondary | ICD-10-CM | POA: Diagnosis not present

## 2019-04-16 ENCOUNTER — Other Ambulatory Visit: Payer: Self-pay

## 2019-04-16 MED ORDER — METOPROLOL SUCCINATE ER 50 MG PO TB24: ORAL_TABLET | ORAL | 0 refills | Status: DC

## 2019-05-07 DIAGNOSIS — Z23 Encounter for immunization: Secondary | ICD-10-CM | POA: Diagnosis not present

## 2019-05-18 ENCOUNTER — Other Ambulatory Visit: Payer: Self-pay | Admitting: Cardiovascular Disease

## 2019-05-31 ENCOUNTER — Encounter: Payer: Self-pay | Admitting: Internal Medicine

## 2019-05-31 ENCOUNTER — Ambulatory Visit (INDEPENDENT_AMBULATORY_CARE_PROVIDER_SITE_OTHER): Payer: Medicare Other | Admitting: Internal Medicine

## 2019-05-31 ENCOUNTER — Other Ambulatory Visit: Payer: Self-pay

## 2019-05-31 VITALS — BP 158/82 | HR 84 | Ht 73.0 in | Wt 218.0 lb

## 2019-05-31 DIAGNOSIS — I119 Hypertensive heart disease without heart failure: Secondary | ICD-10-CM | POA: Diagnosis not present

## 2019-05-31 DIAGNOSIS — D6869 Other thrombophilia: Secondary | ICD-10-CM | POA: Diagnosis not present

## 2019-05-31 DIAGNOSIS — I480 Paroxysmal atrial fibrillation: Secondary | ICD-10-CM | POA: Diagnosis not present

## 2019-05-31 MED ORDER — BLOOD PRESSURE CUFF MISC: 1.0000 | 0 refills | Status: DC

## 2019-05-31 MED ORDER — BLOOD PRESSURE MONITOR AUTOMAT DEVI: 1.0000 | 0 refills | Status: AC

## 2019-05-31 NOTE — Progress Notes (Signed)
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 male who presents today for routine electrophysiology followup.  Since last being seen in our clinic, the patient reports doing very well.  He had COVID in January but has recovered.  Today, he denies symptoms of palpitations, chest pain, shortness of breath,  lower extremity edema, dizziness, presyncope, or syncope.  The patient is otherwise without complaint today.   Past Medical History:  Diagnosis Date  . Atrial fibrillation ([place])   . Dyslipidemia   . Ejection fraction    EF 55-60%, echo, May, 2014  . Heart murmur    As a Child  . Hypertension   . Overweight(278.02)   . Type II diabetes mellitus ([place])    Past Surgical History:  Procedure Laterality Date  . ATRIAL FIBRILLATION ABLATION N/A 03/02/2017   Procedure: ATRIAL FIBRILLATION ABLATION;  Surgeon: Thompson Grayer, MD;  Location: [place] [facility];  Service: Cardiovascular;  Laterality: N/A;  . ATRIAL FIBRILLATION ABLATION N/A 10/13/2017   Procedure: ATRIAL FIBRILLATION ABLATION;  Surgeon: Thompson Grayer, MD;  Location: [place] [facility];  Service: Cardiovascular;  Laterality: N/A;  . BACK SURGERY    . CARDIOVERSION N/A 08/11/2017   Procedure: CARDIOVERSION;  Surgeon: Fay Records, MD;  Location: [facility];  Service: Cardiovascular;  Laterality: N/A;  . DECOMPRESSIVE LUMBAR LAMINECTOMY LEVEL 2 Right 08/2009   L3-4; L4-5/notes 08/22/2009  . TEE WITHOUT CARDIOVERSION N/A 08/11/2017   Procedure: TRANSESOPHAGEAL ECHOCARDIOGRAM (TEE) WITH PROPOFOL;  Surgeon: Fay Records, MD;  Location: [facility];  Service: Cardiovascular;  Laterality: N/A;    ROS- all systems are reviewed and negatives except as per HPI above  Current Outpatient Medications  Medication Sig Dispense Refill  . acetaminophen (TYLENOL) 500 MG tablet Take 500 mg by mouth daily. Takes one tablet at bedtime    . amLODipine (NORVASC) 10 MG tablet TAKE 1 TABLET DAILY 90 tablet 1  . apixaban  (ELIQUIS) 5 MG TABS tablet Take 1 tablet (5 mg total) by mouth 2 (two) times daily. 180 tablet 3  . atorvastatin (LIPITOR) 10 MG tablet Take 5 mg by mouth every Monday, Wednesday, and Friday.     . cetirizine (ZYRTEC) 10 MG tablet Take 5-10 mg by mouth daily as needed for allergies.     . cyclobenzaprine (FLEXERIL) 10 MG tablet Take 10 mg by mouth daily as needed for muscle spasms.    Marland Kitchen gabapentin (NEURONTIN) 300 MG capsule Take 600 mg by mouth at bedtime.     Marland Kitchen ketorolac (ACULAR) 0.4 % SOLN Place 1 drop into the left eye 4 (four) times daily.    Marland Kitchen lisinopril-hydrochlorothiazide (PRINZIDE,ZESTORETIC) 20-12.5 MG per tablet Take 2 tablets by mouth daily.     . Melatonin 5 MG TABS Take 5 mg by mouth at bedtime.    . metFORMIN (GLUCOPHAGE-XR) 500 MG 24 hr tablet Take 1,000 mg by mouth 2 (two) times daily.   1  . metoprolol succinate (TOPROL-XL) 50 MG 24 hr tablet TAKE 1 TABLET BY MOUTH DAILY. Please keep upcoming appt with Dr. Rayann Heman in May before anymore refills. Thank you 90 tablet 0  . ofloxacin (OCUFLOX) 0.3 % ophthalmic solution 1 drop 4 (four) times daily.    . pantoprazole (PROTONIX) 40 MG tablet Take 1 tablet by mouth twice daily 180 tablet 0  . prednisoLONE acetate (PRED FORTE) 1 % ophthalmic suspension Place 1 drop into the left eye 4 (four) times daily.  No current facility-administered medications for this visit.    Physical Exam: Vitals:   05/31/19 0838  BP: (!) 158/82  Pulse: 84  SpO2: 98%  Weight: 218 lb (98.9 kg)  Height: 6\' 1"  (1.854 m)    GEN- The patient is well appearing, alert and oriented x 3 today.   Head- normocephalic, atraumatic Eyes-  Sclera clear, conjunctiva pink Ears- hearing intact Oropharynx- clear Lungs-  , normal work of breathing Heart- Regular rate and rhythm  GI- soft  Extremities- no clubbing, cyanosis, or edema  Wt Readings from Last 3 Encounters:  05/31/19 218 lb (98.9 kg)  11/07/18 221 lb (100.2 kg)  06/27/18 221 lb (100.2 kg)    EKG  tracing ordered today is personally reviewed and shows sinus  Assessment and Plan:  1. Paroxysmal atrial fibrillation/ atrial flutter S/p ablation 2/19, 10/19 Doing well, without recurrence On eliquis for chads2vasc score of at least 2 Echo to evaluate for structural changes related to afib.  2. Hypertensive cardiovascular disease Stable No change required today  3. COVID 88 In January Now vaccinated Will obtain echo to make sure that he did not develop myopathy  AF clinic in 6 months 12 months with me  Risks, benefits and potential toxicities for medications prescribed and/or refilled reviewed with patient today.   Thompson Grayer MD, [facility] 05/31/2019 9:22 AM

## 2019-05-31 NOTE — Addendum Note (Signed)
Addended by: Merlene Laughter on: 05/31/2019 09:39 AM   Modules accepted: Orders

## 2019-05-31 NOTE — Patient Instructions (Addendum)
Medication Instructions:   Your physician recommends that you continue on your current medications as directed. Please refer to the Current Medication list given to you today.  Labwork:  NONE  Testing/Procedures: Your physician has requested that you have an echocardiogram. Echocardiography is a painless test that uses sound waves to create images of your heart. It provides your doctor with information about the size and shape of your heart and how well your heart's chambers and valves are working. This procedure takes approximately one hour. There are no restrictions for this procedure.  Follow-Up:  Your physician recommends that you schedule a follow-up appointment in: 6 months in the A-Fib Clinic.  Your physician recommends that you schedule a follow-up appointment in: 1 year with Dr. Rayann Heman.   Any Other Special Instructions Will Be Listed Below (If Applicable).  If you need a refill on your cardiac medications before your next appointment, please call your pharmacy.

## 2019-05-31 NOTE — Addendum Note (Signed)
Addended by: Merlene Laughter on: 05/31/2019 09:52 AM   Modules accepted: Orders

## 2019-06-06 ENCOUNTER — Other Ambulatory Visit: Payer: Self-pay

## 2019-06-06 ENCOUNTER — Ambulatory Visit (INDEPENDENT_AMBULATORY_CARE_PROVIDER_SITE_OTHER): Payer: Medicare Other

## 2019-06-06 DIAGNOSIS — I119 Hypertensive heart disease without heart failure: Secondary | ICD-10-CM | POA: Diagnosis not present

## 2019-06-06 DIAGNOSIS — I480 Paroxysmal atrial fibrillation: Secondary | ICD-10-CM

## 2019-07-28 ENCOUNTER — Other Ambulatory Visit: Payer: Self-pay | Admitting: Nurse Practitioner

## 2019-08-01 ENCOUNTER — Other Ambulatory Visit: Payer: Self-pay | Admitting: Nurse Practitioner

## 2019-08-14 ENCOUNTER — Other Ambulatory Visit: Payer: Self-pay | Admitting: Internal Medicine

## 2019-08-15 DIAGNOSIS — J019 Acute sinusitis, unspecified: Secondary | ICD-10-CM | POA: Diagnosis not present

## 2019-08-19 DIAGNOSIS — E1022 Type 1 diabetes mellitus with diabetic chronic kidney disease: Secondary | ICD-10-CM | POA: Diagnosis not present

## 2019-08-19 DIAGNOSIS — R5383 Other fatigue: Secondary | ICD-10-CM | POA: Diagnosis not present

## 2019-08-19 DIAGNOSIS — N189 Chronic kidney disease, unspecified: Secondary | ICD-10-CM | POA: Diagnosis not present

## 2019-08-19 DIAGNOSIS — I10 Essential (primary) hypertension: Secondary | ICD-10-CM | POA: Diagnosis not present

## 2019-08-19 DIAGNOSIS — E7800 Pure hypercholesterolemia, unspecified: Secondary | ICD-10-CM | POA: Diagnosis not present

## 2019-08-19 DIAGNOSIS — E1129 Type 2 diabetes mellitus with other diabetic kidney complication: Secondary | ICD-10-CM | POA: Diagnosis not present

## 2019-08-19 DIAGNOSIS — E782 Mixed hyperlipidemia: Secondary | ICD-10-CM | POA: Diagnosis not present

## 2019-08-19 DIAGNOSIS — E1165 Type 2 diabetes mellitus with hyperglycemia: Secondary | ICD-10-CM | POA: Diagnosis not present

## 2019-08-19 DIAGNOSIS — E1143 Type 2 diabetes mellitus with diabetic autonomic (poly)neuropathy: Secondary | ICD-10-CM | POA: Diagnosis not present

## 2019-08-22 DIAGNOSIS — Z6828 Body mass index (BMI) 28.0-28.9, adult: Secondary | ICD-10-CM | POA: Diagnosis not present

## 2019-08-22 DIAGNOSIS — E1165 Type 2 diabetes mellitus with hyperglycemia: Secondary | ICD-10-CM | POA: Diagnosis not present

## 2019-08-22 DIAGNOSIS — F5101 Primary insomnia: Secondary | ICD-10-CM | POA: Diagnosis not present

## 2019-08-22 DIAGNOSIS — R800 Isolated proteinuria: Secondary | ICD-10-CM | POA: Diagnosis not present

## 2019-08-22 DIAGNOSIS — E782 Mixed hyperlipidemia: Secondary | ICD-10-CM | POA: Diagnosis not present

## 2019-08-22 DIAGNOSIS — E1140 Type 2 diabetes mellitus with diabetic neuropathy, unspecified: Secondary | ICD-10-CM | POA: Diagnosis not present

## 2019-08-22 DIAGNOSIS — E1129 Type 2 diabetes mellitus with other diabetic kidney complication: Secondary | ICD-10-CM | POA: Diagnosis not present

## 2019-10-22 DIAGNOSIS — Z23 Encounter for immunization: Secondary | ICD-10-CM | POA: Diagnosis not present

## 2019-10-28 DIAGNOSIS — I10 Essential (primary) hypertension: Secondary | ICD-10-CM | POA: Diagnosis not present

## 2019-10-28 DIAGNOSIS — E1129 Type 2 diabetes mellitus with other diabetic kidney complication: Secondary | ICD-10-CM | POA: Diagnosis not present

## 2019-10-28 DIAGNOSIS — R600 Localized edema: Secondary | ICD-10-CM | POA: Diagnosis not present

## 2019-10-28 DIAGNOSIS — Z6828 Body mass index (BMI) 28.0-28.9, adult: Secondary | ICD-10-CM | POA: Diagnosis not present

## 2019-10-28 DIAGNOSIS — M7989 Other specified soft tissue disorders: Secondary | ICD-10-CM | POA: Diagnosis not present

## 2019-10-28 DIAGNOSIS — G629 Polyneuropathy, unspecified: Secondary | ICD-10-CM | POA: Diagnosis not present

## 2019-11-05 ENCOUNTER — Other Ambulatory Visit: Payer: Self-pay | Admitting: Internal Medicine

## 2019-12-13 DIAGNOSIS — N189 Chronic kidney disease, unspecified: Secondary | ICD-10-CM | POA: Diagnosis not present

## 2019-12-13 DIAGNOSIS — E782 Mixed hyperlipidemia: Secondary | ICD-10-CM | POA: Diagnosis not present

## 2019-12-13 DIAGNOSIS — E1129 Type 2 diabetes mellitus with other diabetic kidney complication: Secondary | ICD-10-CM | POA: Diagnosis not present

## 2019-12-13 DIAGNOSIS — E1140 Type 2 diabetes mellitus with diabetic neuropathy, unspecified: Secondary | ICD-10-CM | POA: Diagnosis not present

## 2019-12-13 DIAGNOSIS — I10 Essential (primary) hypertension: Secondary | ICD-10-CM | POA: Diagnosis not present

## 2019-12-13 DIAGNOSIS — R5383 Other fatigue: Secondary | ICD-10-CM | POA: Diagnosis not present

## 2019-12-13 DIAGNOSIS — E7800 Pure hypercholesterolemia, unspecified: Secondary | ICD-10-CM | POA: Diagnosis not present

## 2019-12-13 DIAGNOSIS — E1165 Type 2 diabetes mellitus with hyperglycemia: Secondary | ICD-10-CM | POA: Diagnosis not present

## 2019-12-17 DIAGNOSIS — E1129 Type 2 diabetes mellitus with other diabetic kidney complication: Secondary | ICD-10-CM | POA: Diagnosis not present

## 2019-12-17 DIAGNOSIS — E782 Mixed hyperlipidemia: Secondary | ICD-10-CM | POA: Diagnosis not present

## 2019-12-17 DIAGNOSIS — Z6828 Body mass index (BMI) 28.0-28.9, adult: Secondary | ICD-10-CM | POA: Diagnosis not present

## 2019-12-17 DIAGNOSIS — E1165 Type 2 diabetes mellitus with hyperglycemia: Secondary | ICD-10-CM | POA: Diagnosis not present

## 2019-12-17 DIAGNOSIS — E1140 Type 2 diabetes mellitus with diabetic neuropathy, unspecified: Secondary | ICD-10-CM | POA: Diagnosis not present

## 2019-12-17 DIAGNOSIS — Z23 Encounter for immunization: Secondary | ICD-10-CM | POA: Diagnosis not present

## 2019-12-17 DIAGNOSIS — R800 Isolated proteinuria: Secondary | ICD-10-CM | POA: Diagnosis not present

## 2019-12-31 DIAGNOSIS — Z6828 Body mass index (BMI) 28.0-28.9, adult: Secondary | ICD-10-CM | POA: Diagnosis not present

## 2019-12-31 DIAGNOSIS — L259 Unspecified contact dermatitis, unspecified cause: Secondary | ICD-10-CM | POA: Diagnosis not present

## 2019-12-31 DIAGNOSIS — M25561 Pain in right knee: Secondary | ICD-10-CM | POA: Diagnosis not present

## 2020-01-02 DIAGNOSIS — H40013 Open angle with borderline findings, low risk, bilateral: Secondary | ICD-10-CM | POA: Diagnosis not present

## 2020-04-09 DIAGNOSIS — E782 Mixed hyperlipidemia: Secondary | ICD-10-CM | POA: Diagnosis not present

## 2020-04-09 DIAGNOSIS — I10 Essential (primary) hypertension: Secondary | ICD-10-CM | POA: Diagnosis not present

## 2020-04-09 DIAGNOSIS — E7849 Other hyperlipidemia: Secondary | ICD-10-CM | POA: Diagnosis not present

## 2020-04-09 DIAGNOSIS — E1140 Type 2 diabetes mellitus with diabetic neuropathy, unspecified: Secondary | ICD-10-CM | POA: Diagnosis not present

## 2020-04-15 DIAGNOSIS — Z1331 Encounter for screening for depression: Secondary | ICD-10-CM | POA: Diagnosis not present

## 2020-04-15 DIAGNOSIS — E1129 Type 2 diabetes mellitus with other diabetic kidney complication: Secondary | ICD-10-CM | POA: Diagnosis not present

## 2020-04-15 DIAGNOSIS — F5101 Primary insomnia: Secondary | ICD-10-CM | POA: Diagnosis not present

## 2020-04-15 DIAGNOSIS — E1165 Type 2 diabetes mellitus with hyperglycemia: Secondary | ICD-10-CM | POA: Diagnosis not present

## 2020-04-15 DIAGNOSIS — R800 Isolated proteinuria: Secondary | ICD-10-CM | POA: Diagnosis not present

## 2020-04-15 DIAGNOSIS — Z1389 Encounter for screening for other disorder: Secondary | ICD-10-CM | POA: Diagnosis not present

## 2020-04-15 DIAGNOSIS — E1140 Type 2 diabetes mellitus with diabetic neuropathy, unspecified: Secondary | ICD-10-CM | POA: Diagnosis not present

## 2020-05-08 ENCOUNTER — Encounter: Payer: Self-pay | Admitting: Internal Medicine

## 2020-05-08 ENCOUNTER — Ambulatory Visit (INDEPENDENT_AMBULATORY_CARE_PROVIDER_SITE_OTHER): Payer: Medicare Other | Admitting: Internal Medicine

## 2020-05-08 VITALS — BP 186/62 | HR 74 | Ht 73.0 in | Wt 213.0 lb

## 2020-05-08 DIAGNOSIS — I10 Essential (primary) hypertension: Secondary | ICD-10-CM | POA: Diagnosis not present

## 2020-05-08 DIAGNOSIS — D6869 Other thrombophilia: Secondary | ICD-10-CM | POA: Diagnosis not present

## 2020-05-08 DIAGNOSIS — I480 Paroxysmal atrial fibrillation: Secondary | ICD-10-CM | POA: Diagnosis not present

## 2020-05-08 NOTE — Addendum Note (Signed)
Addended by: Christella Scheuermann C on: 05/08/2020 09:29 AM   Modules accepted: Orders

## 2020-05-08 NOTE — Patient Instructions (Signed)
Medication Instructions:  Continue all current medications.  Labwork: none  Testing/Procedures: none  Follow-Up: Your physician wants you to follow up in:  1 year.  You will receive a reminder letter in the mail one-two months in advance.  If you don't receive a letter, please call our office to schedule the follow up appointment - AFib Clinic   Any Other Special Instructions Will Be Listed Below (If Applicable).  If you need a refill on your cardiac medications before your next appointment, please call your pharmacy.

## 2020-05-08 NOTE — Progress Notes (Signed)
PCP: Manon Hilding, MD Primary Cardiologist: Dr Harl Bowie Primary EP: Dr Guadalupe Dawn is a 70 y.o. male who presents today for routine electrophysiology followup.  Since last being seen in our clinic, the patient reports doing very well.  Today, he denies symptoms of palpitations, chest pain, shortness of breath,  lower extremity edema, dizziness, presyncope, or syncope.  The patient is otherwise without complaint today.   Past Medical History:  Diagnosis Date  . Atrial fibrillation ([place])   . Dyslipidemia   . Ejection fraction    EF 55-60%, echo, May, 2014  . Heart murmur    As a Child  . Hypertension   . Overweight(278.02)   . Type II diabetes mellitus ([place])    Past Surgical History:  Procedure Laterality Date  . ATRIAL FIBRILLATION ABLATION N/A 03/02/2017   Procedure: ATRIAL FIBRILLATION ABLATION;  Surgeon: Thompson Grayer, MD;  Location: [place] [facility];  Service: Cardiovascular;  Laterality: N/A;  . ATRIAL FIBRILLATION ABLATION N/A 10/13/2017   Procedure: ATRIAL FIBRILLATION ABLATION;  Surgeon: Thompson Grayer, MD;  Location: [place] [facility];  Service: Cardiovascular;  Laterality: N/A;  . BACK SURGERY    . CARDIOVERSION N/A 08/11/2017   Procedure: CARDIOVERSION;  Surgeon: Fay Records, MD;  [facility];  Service: Cardiovascular;  Laterality: N/A;  . DECOMPRESSIVE LUMBAR LAMINECTOMY LEVEL 2 Right 08/2009   L3-4; L4-5/notes 08/22/2009  . TEE WITHOUT CARDIOVERSION N/A 08/11/2017   Procedure: TRANSESOPHAGEAL ECHOCARDIOGRAM (TEE) WITH PROPOFOL;  Surgeon: Fay Records, MD;  [facility]: [facility];  Service: Cardiovascular;  Laterality: N/A;    ROS- all systems are reviewed and negatives except as per HPI above  Current Outpatient Medications  Medication Sig Dispense Refill  . acetaminophen (TYLENOL) 500 MG tablet Take 500 mg by mouth daily. Takes one tablet at bedtime    . amLODipine (NORVASC) 10 MG tablet TAKE 1 TABLET DAILY 90 tablet 1  . apixaban (ELIQUIS) 5 MG TABS  tablet Take 1 tablet (5 mg total) by mouth 2 (two) times daily. 180 tablet 3  . atorvastatin (LIPITOR) 10 MG tablet Take 5 mg by mouth every Monday, Wednesday, and Friday.     . Blood Pressure Monitoring (BLOOD PRESSURE MONITOR AUTOMAT) DEVI 1 each by Does not apply route as directed. Large cuff digital Blood Pressure Monitor Dx: hypertensive heart disease 1 each 0  . cetirizine (ZYRTEC) 10 MG tablet Take 5-10 mg by mouth daily as needed for allergies.     . cyclobenzaprine (FLEXERIL) 10 MG tablet Take 10 mg by mouth daily as needed for muscle spasms.    Marland Kitchen gabapentin (NEURONTIN) 300 MG capsule Take 600 mg by mouth at bedtime.     Marland Kitchen lisinopril-hydrochlorothiazide (PRINZIDE,ZESTORETIC) 20-12.5 MG per tablet Take 2 tablets by mouth daily.     . Melatonin 5 MG TABS Take 5 mg by mouth at bedtime.    . metFORMIN (GLUCOPHAGE-XR) 500 MG 24 hr tablet Take 1,000 mg by mouth 2 (two) times daily.   1  . metoprolol succinate (TOPROL-XL) 50 MG 24 hr tablet TAKE 1 TABLET DAILY 90 tablet 1  . pantoprazole (PROTONIX) 40 MG tablet Take 1 tablet by mouth twice daily 180 tablet 0   No current facility-administered medications for this visit.    Physical Exam: Vitals:   05/08/20 0843  BP: (!) 186/62  Pulse: 74  SpO2: 99%  Weight: 213 lb (96.6 kg)  Height: 6\' 1"  (1.854 m)    GEN- The patient is well  appearing, alert and oriented x 3 today.   Head- normocephalic, atraumatic Eyes-  Sclera clear, conjunctiva pink Ears- hearing intact Oropharynx- clear Lungs- Clear to ausculation bilaterally, normal work of breathing Heart- Regular rate and rhythm, no murmurs, rubs or gallops, PMI not laterally displaced GI- soft, NT, ND, + BS Extremities- no clubbing, cyanosis, or edema  Wt Readings from Last 3 Encounters:  05/08/20 213 lb (96.6 kg)  05/31/19 218 lb (98.9 kg)  11/07/18 221 lb (100.2 kg)   Echo 06/06/19- EF 65% EKG tracing ordered today is personally reviewed and shows sinus  Assessment and  Plan:  1. Paroxysmal atrial fibrillation/ atrial flutter S/p  Ablation 2/19, 10/19 Doing very well  chads2vasc score is 2.  He is on eliquis  2. HTN Elevated He reports good control at home and does not wish to make changes He has not yet taken his medicines today No change required today   Follow-up in AF clinic in a year  Thompson Grayer MD, [facility] 05/08/2020 9:18 AM

## 2020-07-03 ENCOUNTER — Other Ambulatory Visit: Payer: Self-pay | Admitting: Internal Medicine

## 2020-07-08 ENCOUNTER — Other Ambulatory Visit: Payer: Self-pay | Admitting: Internal Medicine

## 2020-08-05 ENCOUNTER — Other Ambulatory Visit: Payer: Self-pay | Admitting: Internal Medicine

## 2020-08-18 DIAGNOSIS — I10 Essential (primary) hypertension: Secondary | ICD-10-CM | POA: Diagnosis not present

## 2020-08-18 DIAGNOSIS — E782 Mixed hyperlipidemia: Secondary | ICD-10-CM | POA: Diagnosis not present

## 2020-08-18 DIAGNOSIS — R5383 Other fatigue: Secondary | ICD-10-CM | POA: Diagnosis not present

## 2020-08-18 DIAGNOSIS — N189 Chronic kidney disease, unspecified: Secondary | ICD-10-CM | POA: Diagnosis not present

## 2020-08-18 DIAGNOSIS — E7849 Other hyperlipidemia: Secondary | ICD-10-CM | POA: Diagnosis not present

## 2020-08-18 DIAGNOSIS — E1143 Type 2 diabetes mellitus with diabetic autonomic (poly)neuropathy: Secondary | ICD-10-CM | POA: Diagnosis not present

## 2020-08-27 DIAGNOSIS — E1165 Type 2 diabetes mellitus with hyperglycemia: Secondary | ICD-10-CM | POA: Diagnosis not present

## 2020-08-27 DIAGNOSIS — E7849 Other hyperlipidemia: Secondary | ICD-10-CM | POA: Diagnosis not present

## 2020-08-27 DIAGNOSIS — E1129 Type 2 diabetes mellitus with other diabetic kidney complication: Secondary | ICD-10-CM | POA: Diagnosis not present

## 2020-08-27 DIAGNOSIS — Z23 Encounter for immunization: Secondary | ICD-10-CM | POA: Diagnosis not present

## 2020-08-27 DIAGNOSIS — Z1212 Encounter for screening for malignant neoplasm of rectum: Secondary | ICD-10-CM | POA: Diagnosis not present

## 2020-08-27 DIAGNOSIS — E1140 Type 2 diabetes mellitus with diabetic neuropathy, unspecified: Secondary | ICD-10-CM | POA: Diagnosis not present

## 2020-08-27 DIAGNOSIS — R800 Isolated proteinuria: Secondary | ICD-10-CM | POA: Diagnosis not present

## 2020-10-22 DIAGNOSIS — Z23 Encounter for immunization: Secondary | ICD-10-CM | POA: Diagnosis not present

## 2020-11-05 DIAGNOSIS — Z23 Encounter for immunization: Secondary | ICD-10-CM | POA: Diagnosis not present

## 2020-11-13 ENCOUNTER — Encounter: Payer: Self-pay | Admitting: Internal Medicine

## 2020-12-02 DIAGNOSIS — J329 Chronic sinusitis, unspecified: Secondary | ICD-10-CM | POA: Diagnosis not present

## 2020-12-02 DIAGNOSIS — J40 Bronchitis, not specified as acute or chronic: Secondary | ICD-10-CM | POA: Diagnosis not present

## 2020-12-02 DIAGNOSIS — Z20828 Contact with and (suspected) exposure to other viral communicable diseases: Secondary | ICD-10-CM | POA: Diagnosis not present

## 2020-12-21 DIAGNOSIS — E782 Mixed hyperlipidemia: Secondary | ICD-10-CM | POA: Diagnosis not present

## 2020-12-21 DIAGNOSIS — E1143 Type 2 diabetes mellitus with diabetic autonomic (poly)neuropathy: Secondary | ICD-10-CM | POA: Diagnosis not present

## 2020-12-21 DIAGNOSIS — E7849 Other hyperlipidemia: Secondary | ICD-10-CM | POA: Diagnosis not present

## 2020-12-21 DIAGNOSIS — R5383 Other fatigue: Secondary | ICD-10-CM | POA: Diagnosis not present

## 2020-12-21 DIAGNOSIS — N189 Chronic kidney disease, unspecified: Secondary | ICD-10-CM | POA: Diagnosis not present

## 2020-12-21 DIAGNOSIS — I10 Essential (primary) hypertension: Secondary | ICD-10-CM | POA: Diagnosis not present

## 2020-12-23 DIAGNOSIS — E1165 Type 2 diabetes mellitus with hyperglycemia: Secondary | ICD-10-CM | POA: Diagnosis not present

## 2020-12-23 DIAGNOSIS — R800 Isolated proteinuria: Secondary | ICD-10-CM | POA: Diagnosis not present

## 2020-12-23 DIAGNOSIS — E1140 Type 2 diabetes mellitus with diabetic neuropathy, unspecified: Secondary | ICD-10-CM | POA: Diagnosis not present

## 2020-12-23 DIAGNOSIS — E7849 Other hyperlipidemia: Secondary | ICD-10-CM | POA: Diagnosis not present

## 2020-12-23 DIAGNOSIS — G629 Polyneuropathy, unspecified: Secondary | ICD-10-CM | POA: Diagnosis not present

## 2020-12-23 DIAGNOSIS — F5101 Primary insomnia: Secondary | ICD-10-CM | POA: Diagnosis not present

## 2020-12-23 DIAGNOSIS — E1129 Type 2 diabetes mellitus with other diabetic kidney complication: Secondary | ICD-10-CM | POA: Diagnosis not present

## 2020-12-30 ENCOUNTER — Other Ambulatory Visit: Payer: Self-pay

## 2020-12-30 ENCOUNTER — Encounter: Payer: Self-pay | Admitting: *Deleted

## 2020-12-30 ENCOUNTER — Ambulatory Visit (INDEPENDENT_AMBULATORY_CARE_PROVIDER_SITE_OTHER): Payer: Medicare Other | Admitting: Internal Medicine

## 2020-12-30 ENCOUNTER — Encounter: Payer: Self-pay | Admitting: Internal Medicine

## 2020-12-30 VITALS — BP 155/74 | HR 70 | Temp 97.5°F | Ht 73.0 in | Wt 212.6 lb

## 2020-12-30 DIAGNOSIS — R195 Other fecal abnormalities: Secondary | ICD-10-CM

## 2020-12-30 DIAGNOSIS — K219 Gastro-esophageal reflux disease without esophagitis: Secondary | ICD-10-CM | POA: Diagnosis not present

## 2020-12-30 MED ORDER — PEG 3350-KCL-NA BICARB-NACL 420 G PO SOLR: ORAL | 0 refills | Status: DC

## 2020-12-30 NOTE — Addendum Note (Signed)
Addended by: Cheron Every on: 12/30/2020 10:53 AM   Modules accepted: Orders

## 2020-12-30 NOTE — Progress Notes (Signed)
Primary Care Physician:  Manon Hilding, MD Primary Gastroenterologist:  Dr. Abbey Chatters  Chief Complaint  Patient presents with   Colonoscopy    Never had tcs. No fhcrc   heme+    HPI:   Nathan Sutton is a 71 y.o. male who presents to clinic today by referral from his PCP Dr. Quintin Alto for evaluation.  Patient with recent Hemoccult testing positive for blood.  No previous colonoscopy.  Denies any frank melena hematochezia.  No unintentional weight loss.  No abdominal pain.  No family history of colorectal malignancy.  Does have chronic GERD which is well controlled on pantoprazole daily previously on twice daily as this was decreased.  States this started after having ablation for atrial fibrillation.  Chronically takes Eliquis.  No epigastric or chest pain.  No dysphagia odynophagia.  Past Medical History:  Diagnosis Date   Atrial fibrillation ([place])    Dyslipidemia    Ejection fraction    EF 55-60%, echo, May, 2014   Heart murmur    As a Child   Hypertension    Overweight(278.02)    Type II diabetes mellitus ([place])     Past Surgical History:  Procedure Laterality Date   ATRIAL FIBRILLATION ABLATION N/A 03/02/2017   Procedure: ATRIAL FIBRILLATION ABLATION;  Surgeon: Thompson Grayer, MD;  Location: [place] [facility];  Service: Cardiovascular;  Laterality: N/A;   ATRIAL FIBRILLATION ABLATION N/A 10/13/2017   Procedure: ATRIAL FIBRILLATION ABLATION;  Surgeon: Thompson Grayer, MD;  Location: [place] [facility];  Service: Cardiovascular;  Laterality: N/A;   BACK SURGERY     CARDIOVERSION N/A 08/11/2017   Procedure: CARDIOVERSION;  Surgeon: Fay Records, MD;  Location: [facility];  Service: Cardiovascular;  Laterality: N/A;   DECOMPRESSIVE LUMBAR LAMINECTOMY LEVEL 2 Right 08/2009   L3-4; L4-5/notes 08/22/2009   TEE WITHOUT CARDIOVERSION N/A 08/11/2017   Procedure: TRANSESOPHAGEAL ECHOCARDIOGRAM (TEE) WITH PROPOFOL;  Surgeon: Fay Records, MD;  Location: [facility];  Service: Cardiovascular;   Laterality: N/A;    Current Outpatient Medications  Medication Sig Dispense Refill   acetaminophen (TYLENOL) 500 MG tablet Take 500-1,000 mg by mouth every 6 (six) hours as needed.     amLODipine (NORVASC) 10 MG tablet TAKE 1 TABLET DAILY 90 tablet 3   apixaban (ELIQUIS) 5 MG TABS tablet Take 1 tablet (5 mg total) by mouth 2 (two) times daily. 180 tablet 3   atorvastatin (LIPITOR) 10 MG tablet Take 5 mg by mouth every Monday, Wednesday, and Friday.      Azelastine HCl 137 MCG/SPRAY SOLN Place 1 spray into both nostrils daily.     Blood Pressure Monitoring (BLOOD PRESSURE MONITOR AUTOMAT) DEVI 1 each by Does not apply route as directed. Large cuff digital Blood Pressure Monitor Dx: hypertensive heart disease 1 each 0   cetirizine (ZYRTEC) 10 MG tablet Take 5-10 mg by mouth daily as needed for allergies.      cyclobenzaprine (FLEXERIL) 10 MG tablet Take 10 mg by mouth daily as needed for muscle spasms.     fluticasone (FLONASE) 50 MCG/ACT nasal spray Place 2 sprays into both nostrils daily.     gabapentin (NEURONTIN) 300 MG capsule Take 600 mg by mouth at bedtime.      lisinopril-hydrochlorothiazide (PRINZIDE,ZESTORETIC) 20-12.5 MG per tablet Take 2 tablets by mouth daily.      metFORMIN (GLUCOPHAGE-XR) 500 MG 24 hr tablet Take 1,000 mg by mouth 2 (two) times daily.   1   metoprolol succinate (TOPROL-XL) 50 MG  24 hr tablet TAKE 1 TABLET DAILY (DOSE  CHANGE 08/09/17) 30 tablet 0   pantoprazole (PROTONIX) 40 MG tablet Take 1 tablet by mouth twice daily 180 tablet 0   Melatonin 5 MG TABS Take 5 mg by mouth at bedtime. (Patient not taking: Reported on 12/30/2020)     No current facility-administered medications for this visit.    Allergies as of 12/30/2020   (No Known Allergies)    Family History  Problem Relation Age of Onset   Heart failure Mother    Stroke Father     Social History   Socioeconomic History   Marital status: Married    Spouse name: Not on file   Number of children:  Not on file   Years of education: Not on file   Highest education level: Not on file  Occupational History   Not on file  Tobacco Use   Smoking status: Never   Smokeless tobacco: Never  Vaping Use   Vaping Use: Never used  Substance and Sexual Activity   Alcohol use: No    Alcohol/week: 0.0 standard drinks   Drug use: No   Sexual activity: Not Currently  Other Topics Concern   Not on file  Social History Narrative   Not on file   Social Determinants of Health   Financial Resource Strain: Not on file  Food Insecurity: Not on file  Transportation Needs: Not on file  Physical Activity: Not on file  Stress: Not on file  Social Connections: Not on file  Intimate Partner Violence: Not on file    Subjective: Review of Systems  Constitutional:  Negative for chills and fever.  HENT:  Negative for congestion and hearing loss.   Eyes:  Negative for blurred vision and double vision.  Respiratory:  Negative for cough and shortness of breath.   Cardiovascular:  Negative for chest pain and palpitations.  Gastrointestinal:  Positive for heartburn. Negative for abdominal pain, blood in stool, constipation, diarrhea, melena and vomiting.  Genitourinary:  Negative for dysuria and urgency.  Musculoskeletal:  Negative for joint pain and myalgias.  Skin:  Negative for itching and rash.  Neurological:  Negative for dizziness and headaches.  Psychiatric/Behavioral:  Negative for depression. The patient is not nervous/anxious.       Objective: BP (!) 155/74    Pulse 70    Temp (!) 97.5 F (36.4 C) (Temporal)    Ht 6\' 1"  (1.854 m)    Wt 212 lb 9.6 oz (96.4 kg)    BMI 28.05 kg/m  Physical Exam Constitutional:      Appearance: Normal appearance.  HENT:     Head: Normocephalic and atraumatic.  Eyes:     Extraocular Movements: Extraocular movements intact.     Conjunctiva/sclera: Conjunctivae normal.  Cardiovascular:     Rate and Rhythm: Normal rate and regular rhythm.  Pulmonary:      Effort: Pulmonary effort is normal.     Breath sounds: Normal breath sounds.  Abdominal:     General: Bowel sounds are normal.     Palpations: Abdomen is soft.  Musculoskeletal:        General: Normal range of motion.     Cervical back: Normal range of motion and neck supple.  Skin:    General: Skin is warm.  Neurological:     General: No focal deficit present.     Mental Status: He is alert and oriented to person, place, and time.  Psychiatric:  Mood and Affect: Mood normal.        Behavior: Behavior normal.     Assessment: *Heme positive stool *GERD-well controlled on pantoprazole   Plan: Will schedule for diagnostic colonoscopy.The risks including infection, bleed, or perforation as well as benefits, limitations, alternatives and imponderables have been reviewed with the patient. Questions have been answered. All parties agreeable.  Patient will need to hold Eliquis x48 hours prior to procedure.  Discussed that there is a very slight increased risk of stroke during this time and he understands.  Continue on pantoprazole for chronic GERD which is well controlled.  No alarm features today to warrant upper endoscopy.  We will print home practices to decrease reflux symptoms.  Thank you Dr. Quintin Alto for the kind referral.   12/30/2020 10:13 AM   Disclaimer: This note was dictated with voice recognition software. Similar sounding words can inadvertently be transcribed and may not be corrected upon review.

## 2020-12-30 NOTE — Patient Instructions (Signed)
We will schedule you for colonoscopy to further evaluate your positive stool test.  Further recommendations to follow.  Continue on pantoprazole 40 mg daily for your chronic reflux.  It was nice meeting you today.  I hope you have a happy new year.  Dr. Abbey Chatters  Lifestyle and home remedies TO MANAGE REFLUX/HEARTBURN    You may eliminate or reduce the frequency of heartburn by making the following lifestyle changes:   Control your weight. Being overweight is a major risk factor for heartburn and GERD. Excess pounds put pressure on your abdomen, pushing up your stomach and causing acid to back up into your esophagus.    Eat smaller meals. 4 TO 6 MEALS A DAY. This reduces pressure on the lower esophageal sphincter, helping to prevent the valve from opening and acid from washing back into your esophagus.     Loosen your belt. Clothes that fit tightly around your waist put pressure on your abdomen and the lower esophageal sphincter.     Eliminate heartburn triggers. Everyone has specific triggers. Common triggers such as fatty or fried foods, spicy food, tomato sauce, carbonated beverages, alcohol, chocolate, mint, garlic, onion, caffeine and nicotine may make heartburn worse.    Avoid stooping or bending. Tying your shoes is OK. Bending over for longer periods to weed your garden isn't, especially soon after eating.    Don't lie down after a meal. Wait at least three to four hours after eating before going to bed, and don't lie down right after eating.   At [facility] we value your feedback. You may receive a survey about your visit today. Please share your experience as we strive to create trusting relationships with our patients to provide genuine, compassionate, quality care.  We appreciate your understanding and patience as we review any laboratory studies, imaging, and other diagnostic tests that are ordered as we care for you. Our office policy is 5 business days for  review of these results, and any emergent or urgent results are addressed in a timely manner for your best interest. If you do not hear from our office in 1 week, please contact us.   We also encourage the use of MyChart, which contains your medical information for your review as well. If you are not enrolled in this feature, an access code is on this after visit summary for your convenience. Thank you for allowing [place] to be involved in your care.  It was great to see you today!  I hope you have a great rest of your Winter!    Elon Alas. Abbey Chatters, D.O. Gastroenterology and Hepatology [facility]

## 2021-01-13 ENCOUNTER — Telehealth: Payer: Self-pay | Admitting: Internal Medicine

## 2021-01-13 NOTE — Telephone Encounter (Signed)
PATIENT NEEDS TO RESCHEDULE HIS PROCEDURE INTO MARCH

## 2021-01-13 NOTE — Telephone Encounter (Signed)
Called pt. Made aware will call to r/s once we receive our March schedule. He voiced understanding. Message sent to endo

## 2021-01-20 ENCOUNTER — Encounter (HOSPITAL_COMMUNITY): Payer: Medicare Other

## 2021-01-25 ENCOUNTER — Encounter (HOSPITAL_COMMUNITY): Payer: Self-pay

## 2021-01-25 ENCOUNTER — Ambulatory Visit (HOSPITAL_COMMUNITY): Admit: 2021-01-25 | Payer: Medicare Other

## 2021-01-25 SURGERY — COLONOSCOPY WITH PROPOFOL
Anesthesia: Monitor Anesthesia Care

## 2021-02-09 DIAGNOSIS — H35363 Drusen (degenerative) of macula, bilateral: Secondary | ICD-10-CM | POA: Diagnosis not present

## 2021-02-09 NOTE — Telephone Encounter (Signed)
Tried to call pt to reschedule TCS, LMOAM for return call.

## 2021-02-25 ENCOUNTER — Telehealth: Payer: Self-pay | Admitting: Internal Medicine

## 2021-02-25 NOTE — Telephone Encounter (Signed)
Patient called to reschedule his procedure,  does he need another office visit or triage with angie?

## 2021-02-25 NOTE — Telephone Encounter (Signed)
Called pt. He has a lot of issues going on with his spouse right now. After talking with pt he decided her will call back to schedule once he figures out what is going on with his spouse bc he didn't really know when he could reschedule his procedure for. Patient wanted me to let Dr. Abbey Chatters know what was going on and he has not forget about his TCS.

## 2021-03-02 NOTE — Telephone Encounter (Signed)
Noted  

## 2021-03-18 DIAGNOSIS — U071 COVID-19: Secondary | ICD-10-CM | POA: Diagnosis not present

## 2021-03-23 ENCOUNTER — Telehealth: Payer: Self-pay | Admitting: Internal Medicine

## 2021-03-23 MED ORDER — PEG 3350-KCL-NA BICARB-NACL 420 G PO SOLR: ORAL | 0 refills | Status: DC

## 2021-03-23 NOTE — Addendum Note (Signed)
Addended by: Cheron Every on: 03/23/2021 03:44 PM ? ? Modules accepted: Orders ? ?

## 2021-03-23 NOTE — Telephone Encounter (Signed)
PATIENT CALLED TO SCHEDULE HIS PROCEDURE [phone]   ?

## 2021-03-23 NOTE — Telephone Encounter (Signed)
Called pt. He is ready to schedule procedure. He is on for 4/17 at 8:30am. Aware will mail instructions with pre-op appt. Will send rx prep to pharmacy. ?

## 2021-03-24 ENCOUNTER — Encounter: Payer: Self-pay | Admitting: *Deleted

## 2021-04-12 NOTE — Patient Instructions (Addendum)
? ? ? ? ? ? VICK FILTER ? 04/12/2021  ?  ? '@PREFPERIOPPHARMACY'$ @ ? ? Your procedure is scheduled on  04/19/2021. ? ? Report to [facility] at  0700  A.M. ? ? Call this number if you have problems the morning of surgery: ? [phone] ? ? Remember: ? Follow the diet and prep instructions given to you by the office. ? ?Your last dose of eliquis should be on 04/16/2021. ?  ? Take these medicines the morning of surgery with A SIP OF WATER  ? ?amlodipine, flexeril(if needed), gabapentin, metoprolol, protonix. ? ?  ? Do not wear jewelry, make-up or nail polish. ? Do not wear lotions, powders, or perfumes, or deodorant. ? Do not shave 48 hours prior to surgery.  Men may shave face and neck. ? Do not bring valuables to the hospital. ? [facility] is not responsible for any belongings or valuables. ? ?Contacts, dentures or bridgework may not be worn into surgery.  Leave your suitcase in the car.  After surgery it may be brought to your room. ? ?For patients admitted to the hospital, discharge time will be determined by your treatment team. ? ?Patients discharged the day of surgery will not be allowed to drive home and must have someone with them for 24 hours.  ? ? ?Special instructions:  DO NOT smoke tobacco or vape for 24 hours before your procedure. ? ?Please read over the following fact sheets that you were given. ?Anesthesia Post-op Instructions and Care and Recovery After Surgery ?  ? ? ? Colonoscopy, Adult, Care After ?This sheet gives you information about how to care for yourself after your procedure. Your health care provider may also give you more specific instructions. If you have problems or questions, contact your health care provider. ?What can I expect after the procedure? ?After the procedure, it is common to have: ?A small amount of blood in your stool for 24 hours after the procedure. ?Some gas. ?Mild cramping or bloating of your abdomen. ?Follow these instructions at home: ?Eating and drinking ? ?Drink  enough fluid to keep your urine pale yellow. ?Follow instructions from your health care provider about eating or drinking restrictions. ?Resume your normal diet as instructed by your health care provider. Avoid heavy or fried foods that are hard to digest. ?Activity ?Rest as told by your health care provider. ?Avoid sitting for a long time without moving. Get up to take short walks every 1-2 hours. This is important to improve blood flow and breathing. Ask for help if you feel weak or unsteady. ?Return to your normal activities as told by your health care provider. Ask your health care provider what activities are safe for you. ?Managing cramping and bloating ? ?Try walking around when you have cramps or feel bloated. ?Apply heat to your abdomen as told by your health care provider. Use the heat source that your health care provider recommends, such as a moist heat pack or a heating pad. ?Place a towel between your skin and the heat source. ?Leave the heat on for 20-30 minutes. ?Remove the heat if your skin turns bright red. This is especially important if you are unable to feel pain, heat, or cold. You may have a greater risk of getting burned. ?General instructions ?If you were given a sedative during the procedure, it can affect you for several hours. Do not drive or operate machinery until your health care provider says that it is safe. ?For the first 24 hours  after the procedure: ?Do not sign important documents. ?Do not drink alcohol. ?Do your regular daily activities at a slower pace than normal. ?Eat soft foods that are easy to digest. ?Take over-the-counter and prescription medicines only as told by your health care provider. ?Keep all follow-up visits as told by your health care provider. This is important. ?Contact a health care provider if: ?You have blood in your stool 2-3 days after the procedure. ?Get help right away if you have: ?More than a small spotting of blood in your stool. ?Large blood clots  in your stool. ?Swelling of your abdomen. ?Nausea or vomiting. ?A fever. ?Increasing pain in your abdomen that is not relieved with medicine. ?Summary ?After the procedure, it is common to have a small amount of blood in your stool. You may also have mild cramping and bloating of your abdomen. ?If you were given a sedative during the procedure, it can affect you for several hours. Do not drive or operate machinery until your health care provider says that it is safe. ?Get help right away if you have a lot of blood in your stool, nausea or vomiting, a fever, or increased pain in your abdomen. ?This information is not intended to replace advice given to you by your health care provider. Make sure you discuss any questions you have with your health care provider. ?Document Revised: 10/26/2018 Document Reviewed: 07/16/2018 ?Elsevier Patient Education ? [place]. ?Monitored Anesthesia Care, Care After ?This sheet gives you information about how to care for yourself after your procedure. Your health care provider may also give you more specific instructions. If you have problems or questions, contact your health care provider. ?What can I expect after the procedure? ?After the procedure, it is common to have: ?Tiredness. ?Forgetfulness about what happened after the procedure. ?Impaired judgment for important decisions. ?Nausea or vomiting. ?Some difficulty with balance. ?Follow these instructions at home: ?For the time period you were told by your health care provider: ?  ?Rest as needed. ?Do not participate in activities where you could fall or become injured. ?Do not drive or use machinery. ?Do not drink alcohol. ?Do not take sleeping pills or medicines that cause drowsiness. ?Do not make important decisions or sign legal documents. ?Do not take care of children on your own. ?Eating and drinking ?Follow the diet that is recommended by your health care provider. ?Drink enough fluid to keep your urine pale  yellow. ?If you vomit: ?Drink water, juice, or soup when you can drink without vomiting. ?Make sure you have little or no nausea before eating solid foods. ?General instructions ?Have a responsible adult stay with you for the time you are told. It is important to have someone help care for you until you are awake and alert. ?Take over-the-counter and prescription medicines only as told by your health care provider. ?If you have sleep apnea, surgery and certain medicines can increase your risk for breathing problems. Follow instructions from your health care provider about wearing your sleep device: ?Anytime you are sleeping, including during daytime naps. ?While taking prescription pain medicines, sleeping medicines, or medicines that make you drowsy. ?Avoid smoking. ?Keep all follow-up visits as told by your health care provider. This is important. ?Contact a health care provider if: ?You keep feeling nauseous or you keep vomiting. ?You feel light-headed. ?You are still sleepy or having trouble with balance after 24 hours. ?You develop a rash. ?You have a fever. ?You have redness or swelling around the IV site. ?Get  help right away if: ?You have trouble breathing. ?You have new-onset confusion at home. ?Summary ?For several hours after your procedure, you may feel tired. You may also be forgetful and have poor judgment. ?Have a responsible adult stay with you for the time you are told. It is important to have someone help care for you until you are awake and alert. ?Rest as told. Do not drive or operate machinery. Do not drink alcohol or take sleeping pills. ?Get help right away if you have trouble breathing, or if you suddenly become confused. ?This information is not intended to replace advice given to you by your health care provider. Make sure you discuss any questions you have with your health care provider. ?Document Revised: 09/05/2019 Document Reviewed: 11/22/2018 ?Elsevier Patient Education ? 2022 [place]. ? ? ?

## 2021-04-14 ENCOUNTER — Other Ambulatory Visit: Payer: Self-pay | Admitting: *Deleted

## 2021-04-14 MED ORDER — PEG 3350-KCL-NA BICARB-NACL 420 G PO SOLR: ORAL | 0 refills | Status: DC

## 2021-04-15 ENCOUNTER — Encounter (HOSPITAL_COMMUNITY)
Admission: RE | Admit: 2021-04-15 | Discharge: 2021-04-15 | Disposition: A | Discharge status: Home / Self Care | Payer: Medicare Other | Source: Ambulatory Visit | Attending: Internal Medicine | Admitting: Internal Medicine

## 2021-04-15 ENCOUNTER — Encounter (HOSPITAL_COMMUNITY): Payer: Self-pay

## 2021-04-15 VITALS — BP 174/87 | HR 71 | Temp 98.4°F | Ht 73.0 in | Wt 214.0 lb

## 2021-04-15 DIAGNOSIS — Z01812 Encounter for preprocedural laboratory examination: Secondary | ICD-10-CM | POA: Diagnosis not present

## 2021-04-15 DIAGNOSIS — E119 Type 2 diabetes mellitus without complications: Secondary | ICD-10-CM

## 2021-04-15 HISTORY — DX: Polyneuropathy, unspecified: G62.9

## 2021-04-15 HISTORY — DX: Type 2 diabetes mellitus with diabetic neuropathy, unspecified: E11.40

## 2021-04-15 LAB — BASIC METABOLIC PANEL
Anion gap: 8 (ref 5–15)
BUN: 18 mg/dL (ref 8–23)
CO2: 29 mmol/L (ref 22–32)
Calcium: 10 mg/dL (ref 8.9–10.3)
Chloride: 103 mmol/L (ref 98–111)
Creatinine, Ser: 1.02 mg/dL (ref 0.61–1.24)
GFR, Estimated: >60 mL/min (ref >60)
Glucose, Bld: 122 mg/dL — ABNORMAL HIGH (ref 70–99)
Potassium: 4.1 mmol/L (ref 3.5–5.1)
Sodium: 140 mmol/L (ref 135–145)

## 2021-04-19 ENCOUNTER — Encounter (HOSPITAL_COMMUNITY)
Admission: RE | Disposition: A | Discharge status: Home / Self Care | Payer: Self-pay | Source: Home / Self Care | Attending: Internal Medicine

## 2021-04-19 ENCOUNTER — Ambulatory Visit (HOSPITAL_COMMUNITY)
Admission: RE | Admit: 2021-04-19 | Discharge: 2021-04-19 | Disposition: A | Discharge status: Home / Self Care | Payer: Medicare Other | Attending: Internal Medicine | Admitting: Internal Medicine

## 2021-04-19 ENCOUNTER — Ambulatory Visit (HOSPITAL_COMMUNITY): Payer: Medicare Other | Admitting: Anesthesiology

## 2021-04-19 ENCOUNTER — Encounter (HOSPITAL_COMMUNITY): Payer: Self-pay

## 2021-04-19 ENCOUNTER — Ambulatory Visit (HOSPITAL_BASED_OUTPATIENT_CLINIC_OR_DEPARTMENT_OTHER): Payer: Medicare Other | Admitting: Anesthesiology

## 2021-04-19 DIAGNOSIS — K51411 Inflammatory polyps of colon with rectal bleeding: Secondary | ICD-10-CM | POA: Diagnosis not present

## 2021-04-19 DIAGNOSIS — K635 Polyp of colon: Secondary | ICD-10-CM

## 2021-04-19 DIAGNOSIS — K5190 Ulcerative colitis, unspecified, without complications: Secondary | ICD-10-CM | POA: Diagnosis not present

## 2021-04-19 DIAGNOSIS — K648 Other hemorrhoids: Secondary | ICD-10-CM | POA: Insufficient documentation

## 2021-04-19 DIAGNOSIS — K6389 Other specified diseases of intestine: Secondary | ICD-10-CM | POA: Diagnosis not present

## 2021-04-19 DIAGNOSIS — E1142 Type 2 diabetes mellitus with diabetic polyneuropathy: Secondary | ICD-10-CM | POA: Insufficient documentation

## 2021-04-19 DIAGNOSIS — I119 Hypertensive heart disease without heart failure: Secondary | ICD-10-CM | POA: Diagnosis not present

## 2021-04-19 DIAGNOSIS — Z1211 Encounter for screening for malignant neoplasm of colon: Secondary | ICD-10-CM

## 2021-04-19 DIAGNOSIS — Z7984 Long term (current) use of oral hypoglycemic drugs: Secondary | ICD-10-CM | POA: Insufficient documentation

## 2021-04-19 DIAGNOSIS — K5140 Inflammatory polyps of colon without complications: Secondary | ICD-10-CM | POA: Diagnosis not present

## 2021-04-19 DIAGNOSIS — E119 Type 2 diabetes mellitus without complications: Secondary | ICD-10-CM | POA: Diagnosis not present

## 2021-04-19 DIAGNOSIS — D122 Benign neoplasm of ascending colon: Secondary | ICD-10-CM | POA: Diagnosis not present

## 2021-04-19 HISTORY — PX: POLYPECTOMY: SHX5525

## 2021-04-19 HISTORY — PX: BIOPSY: SHX5522

## 2021-04-19 HISTORY — PX: COLONOSCOPY WITH PROPOFOL: SHX5780

## 2021-04-19 LAB — GLUCOSE, CAPILLARY: Glucose-Capillary: 116 mg/dL — ABNORMAL HIGH (ref 70–99)

## 2021-04-19 SURGERY — COLONOSCOPY WITH PROPOFOL
Anesthesia: General

## 2021-04-19 MED ORDER — LACTATED RINGERS IV SOLN
INTRAVENOUS | Status: DC
  Administered 2021-04-19: 1000 mL via INTRAVENOUS

## 2021-04-19 MED ORDER — LIDOCAINE HCL (CARDIAC) PF 100 MG/5ML IV SOSY
PREFILLED_SYRINGE | INTRAVENOUS | Status: DC | PRN
  Administered 2021-04-19: 50 mg via INTRAVENOUS

## 2021-04-19 MED ORDER — PROPOFOL 10 MG/ML IV BOLUS
INTRAVENOUS | Status: DC | PRN
  Administered 2021-04-19 (×4): 50 mg via INTRAVENOUS
  Administered 2021-04-19: 40 mg via INTRAVENOUS
  Administered 2021-04-19: 50 mg via INTRAVENOUS
  Administered 2021-04-19: 100 mg via INTRAVENOUS
  Administered 2021-04-19: 50 mg via INTRAVENOUS
  Administered 2021-04-19: 40 mg via INTRAVENOUS

## 2021-04-19 NOTE — Op Note (Signed)
[facility] ?Patient Name: Nathan Sutton ?Procedure Date: 04/19/2021 8:37 AM ?MRN: [id number] ?Date of Birth: [birth year] ?Attending MD: Elon Alas. Abbey Chatters , DO ?CSN: [id number] ?Age: 71 ?Admit Type: Outpatient ?Procedure:                Colonoscopy ?Indications:              Screening for colorectal malignant neoplasm ?Providers:                Elon Alas. Abbey Chatters, DO, Lambert Mody, Anitra  ?                          Gloriann Loan, RN, Randa Spike, Technician ?Referring MD:              ?Medicines:                See the Anesthesia note for documentation of the  ?                          administered medications ?Complications:            No immediate complications. ?Estimated Blood Loss:     Estimated blood loss was minimal. ?Procedure:                Pre-Anesthesia Assessment: ?                          - The anesthesia plan was to use monitored  ?                          anesthesia care (MAC). ?                          After obtaining informed consent, the colonoscope  ?                          was passed under direct vision. Throughout the  ?                          procedure, the patient's blood pressure, pulse, and  ?                          oxygen saturations were monitored continuously. The  ?                          [id number]-[id number] ([id number]) was introduced through the  ?                          anus and advanced to the the cecum, identified by  ?                          appendiceal orifice and ileocecal valve. The  ?                          colonoscopy was performed without difficulty. The  ?                          patient tolerated the procedure well.  The quality  ?                          of the bowel preparation was evaluated using the  ?                          BBPS [facility] Bowel Preparation Scale) with scores  ?                          of: Right Colon = 3, Transverse Colon = 3 and Left  ?                          Colon = 3 (entire mucosa seen well with no residual  ?                           staining, small fragments of stool or opaque  ?                          liquid). The total BBPS score equals 9. ?Scope In: 8:52:34 AM ?Scope Out: 9:35:24 AM ?Scope Withdrawal Time: 0 hours 23 minutes 44 seconds  ?Total Procedure Duration: 0 hours 42 minutes 50 seconds  ?Findings: ?     The perianal and digital rectal examinations were normal. ?     Non-bleeding internal hemorrhoids were found during endoscopy. ?     A 20 mm polyp was found in the ileocecal valve. The polyp was  ?     semi-pedunculated. Actively oozing The polyp was removed with a hot  ?     snare. Resection and retrieval were complete. To close a defect after  ?     polypectomy, three hemostatic clips were successfully placed (MR  ?     conditional). There was no bleeding at the end of the procedure. Polyp  ?     removed with Jabier Mutton net ?     An area of nodular mucosa was found at the ileocecal valve. Biopsies  ?     were taken with a cold forceps for histology. ?Impression:               - Non-bleeding internal hemorrhoids. ?                          - One 20 mm polyp at the ileocecal valve, removed  ?                          with a hot snare. Resected and retrieved. Clips (MR  ?                          conditional) were placed. ?                          - Nodular mucosa at the ileocecal valve. Biopsied. ?Moderate Sedation: ?     Per Anesthesia Care ?Recommendation:           - Patient has a contact number available for  ?  emergencies. The signs and symptoms of potential  ?                          delayed complications were discussed with the  ?                          patient. Return to normal activities tomorrow.  ?                          Written discharge instructions were provided to the  ?                          patient. ?                          - Resume previous diet. ?                          - Continue present medications. ?                          - Await pathology results. ?                          -  Repeat colonoscopy date to be determined after  ?                          pending pathology results are reviewed for  ?                          surveillance based on pathology results. ?                          - Hold Eliquis x2 more days ?                          - KUB prior to any MRI in next 3 months ?Procedure Code(s):        --- Professional --- ?                          [phone], Colonoscopy, flexible; with removal of  ?                          tumor(s), polyp(s), or other lesion(s) by snare  ?                          technique ?                          45380, 59, Colonoscopy, flexible; with biopsy,  ?                          single or multiple ?Diagnosis Code(s):        --- Professional --- ?                          F12.19,  Encounter for screening for malignant  ?                          neoplasm of colon ?                          K63.5, Polyp of colon ?                          K63.89, Other specified diseases of intestine ?                          K64.8, Other hemorrhoids ?CPT copyright 2019 American Medical Association. All rights reserved. ?The codes documented in this report are preliminary and upon coder review may  ?be revised to meet current compliance requirements. ?Elon Alas. Abbey Chatters, DO ?Elon Alas. Abbey Chatters, DO ?04/19/2021 9:41:35 AM ?This report has been signed electronically. ?Number of Addenda: 0 ?

## 2021-04-19 NOTE — H&P (Signed)
?Primary Care Physician:  Manon Hilding, MD ?Primary Gastroenterologist:  Dr. Abbey Chatters ? ?Pre-Procedure History & Physical: ?HPI:  Nathan Sutton is a 71 y.o. male is here for a colonoscopy to be performed for positive hemoccult.  ? ?Past Medical History:  ?Diagnosis Date  ? Atrial fibrillation ([place])   ? Diabetic neuropathy ([place])   ? Dyslipidemia   ? Ejection fraction   ? EF 55-60%, echo, May, 2014  ? Heart murmur   ? As a Child  ? Hypertension   ? Overweight(278.02)   ? Peripheral neuropathy   ? Type II diabetes mellitus ([place])   ? ? ?Past Surgical History:  ?Procedure Laterality Date  ? ATRIAL FIBRILLATION ABLATION N/A 03/02/2017  ? Procedure: ATRIAL FIBRILLATION ABLATION;  Surgeon: Thompson Grayer, MD;  Location: [facility];  Service: Cardiovascular;  Laterality: N/A;  ? ATRIAL FIBRILLATION ABLATION N/A 10/13/2017  ? Procedure: ATRIAL FIBRILLATION ABLATION;  Surgeon: Thompson Grayer, MD;  Location: [facility];  Service: Cardiovascular;  Laterality: N/A;  ? BACK SURGERY    ? CARDIOVERSION N/A 08/11/2017  ? Procedure: CARDIOVERSION;  Surgeon: Fay Records, MD;  Location: [facility];  Service: Cardiovascular;  Laterality: N/A;  ? DECOMPRESSIVE LUMBAR LAMINECTOMY LEVEL 2 Right 08/2009  ? L3-4; L4-5/notes 08/22/2009  ? TEE WITHOUT CARDIOVERSION N/A 08/11/2017  ? Procedure: TRANSESOPHAGEAL ECHOCARDIOGRAM (TEE) WITH PROPOFOL;  Surgeon: Fay Records, MD;  Location: [facility];  Service: Cardiovascular;  Laterality: N/A;  ? ? ?Prior to Admission medications   ?Medication Sig Start Date End Date Taking? Authorizing Provider  ?acetaminophen (TYLENOL) 500 MG tablet Take 500-1,000 mg by mouth every 6 (six) hours as needed for moderate pain.   Yes [provider]  ?amLODipine (NORVASC) 10 MG tablet TAKE 1 TABLET DAILY 08/05/20  Yes Allred, Jeneen Rinks, MD  ?apixaban (ELIQUIS) 5 MG TABS tablet Take 1 tablet (5 mg total) by mouth 2 (two) times daily. 09/26/18  Yes Allred, Jeneen Rinks, MD  ?atorvastatin (LIPITOR) 10 MG tablet Take 5 mg  by mouth every Monday, Wednesday, and Friday.    Yes [provider]  ?Azelastine HCl 137 MCG/SPRAY SOLN Place 1 spray into both nostrils daily as needed (allergies). 12/02/20  Yes [provider]  ?cetirizine (ZYRTEC) 10 MG tablet Take 5-10 mg by mouth daily as needed for allergies.    Yes [provider]  ?fluticasone (FLONASE) 50 MCG/ACT nasal spray Place 2 sprays into both nostrils daily as needed for allergies.   Yes [provider]  ?gabapentin (NEURONTIN) 300 MG capsule Take 300-600 mg by mouth See admin instructions. 300 mg in the evening, 600 mg at bedtime 08/02/17  Yes [provider]  ?lisinopril-hydrochlorothiazide (PRINZIDE,ZESTORETIC) 20-12.5 MG per tablet Take 2 tablets by mouth daily.    Yes [provider]  ?metFORMIN (GLUCOPHAGE-XR) 500 MG 24 hr tablet Take 1,000 mg by mouth 2 (two) times daily.  11/29/14  Yes [provider]  ?metoprolol succinate (TOPROL-XL) 50 MG 24 hr tablet TAKE 1 TABLET DAILY (DOSE  CHANGE 08/09/17) 07/09/20  Yes Allred, Jeneen Rinks, MD  ?pantoprazole (PROTONIX) 40 MG tablet Take 1 tablet by mouth twice daily 04/01/19  Yes Sherran Needs, NP  ?polyethylene glycol-electrolytes (NULYTELY) 420 g solution As directed 04/14/21  Yes Eloise Harman, DO  ?Blood Pressure Monitoring (BLOOD PRESSURE MONITOR AUTOMAT) DEVI 1 each by Does not apply route as directed. Large cuff digital Blood Pressure Monitor ?Dx: hypertensive heart disease 05/31/19   Thompson Grayer, MD  ?cyclobenzaprine (FLEXERIL) 10  MG tablet Take 10 mg by mouth daily as needed for muscle spasms.    [provider]  ? ? ?Allergies as of 03/23/2021  ? (No Known Allergies)  ? ? ?Family History  ?Problem Relation Age of Onset  ? Heart failure Mother   ? Stroke Father   ? ? ?Social History  ? ?Socioeconomic History  ? Marital status: Married  ?  Spouse name: Not on file  ? Number of children: Not on file  ? Years of education: Not on file  ? Highest education  level: Not on file  ?Occupational History  ? Not on file  ?Tobacco Use  ? Smoking status: Never  ? Smokeless tobacco: Never  ?Vaping Use  ? Vaping Use: Never used  ?Substance and Sexual Activity  ? Alcohol use: No  ?  Alcohol/week: 0.0 standard drinks  ? Drug use: No  ? Sexual activity: Not Currently  ?Other Topics Concern  ? Not on file  ?Social History Narrative  ? Not on file  ? ?Social Determinants of Health  ? ?Financial Resource Strain: Not on file  ?Food Insecurity: Not on file  ?Transportation Needs: Not on file  ?Physical Activity: Not on file  ?Stress: Not on file  ?Social Connections: Not on file  ?Intimate Partner Violence: Not on file  ? ? ?Review of Systems: ?See HPI, otherwise negative ROS ? ?Physical Exam: ?Vital signs in last 24 hours: ?Temp:  [98.6 ?F (37 ?C)] 98.6 ?F (37 ?C) (04/17 0745) ?Pulse Rate:  [94-186] 94 (04/17 0815) ?Resp:  [14-20] 14 (04/17 0815) ?BP: (128-151)/(79-91) 151/81 (04/17 0815) ?SpO2:  [98 %-100 %] 98 % (04/17 0815) ?Weight:  [97 kg] 97 kg (04/17 0745) ?  ?General:   Alert,  Well-developed, well-nourished, pleasant and cooperative in NAD ?Head:  Normocephalic and atraumatic. ?Eyes:  Sclera clear, no icterus.   Conjunctiva pink. ?Ears:  Normal auditory acuity. ?Nose:  No deformity, discharge,  or lesions. ?Mouth:  No deformity or lesions, dentition normal. ?Neck:  Supple; no masses or thyromegaly. ?Lungs:  Clear throughout to auscultation.   No wheezes, crackles, or rhonchi. No acute distress. ?Heart:  Regular rate and rhythm; no murmurs, clicks, rubs,  or gallops. ?Abdomen:  Soft, nontender and nondistended. No masses, hepatosplenomegaly or hernias noted. Normal bowel sounds, without guarding, and without rebound.   ?Msk:  Symmetrical without gross deformities. Normal posture. ?Extremities:  Without clubbing or edema. ?Neurologic:  Alert and  oriented x4;  grossly normal neurologically. ?Skin:  Intact without significant lesions or rashes. ?Cervical Nodes:  No significant  cervical adenopathy. ?Psych:  Alert and cooperative. Normal mood and affect. ? ?Impression/Plan: ?Nathan Sutton is here for a colonoscopy to be performed for positive hemoccult.  ? ?The risks of the procedure including infection, bleed, or perforation as well as benefits, limitations, alternatives and imponderables have been reviewed with the patient. Questions have been answered. All parties agreeable. ? ?

## 2021-04-19 NOTE — Anesthesia Procedure Notes (Signed)
Date/Time: 04/19/2021 8:50 AM ?Performed by: Karna Dupes, CRNA ?Pre-anesthesia Checklist: Patient being monitored, Suction available, Emergency Drugs available and Patient identified ?Patient Re-evaluated:Patient Re-evaluated prior to induction ?Oxygen Delivery Method: Nasal cannula ? ? ? ? ?

## 2021-04-19 NOTE — Anesthesia Postprocedure Evaluation (Signed)
Anesthesia Post Note ? ?Patient: GINA COSTILLA ? ?Procedure(s) Performed: COLONOSCOPY WITH PROPOFOL ?POLYPECTOMY ?BIOPSY ? ?Patient location during evaluation: Phase II ?Anesthesia Type: General ?Level of consciousness: awake ?Pain management: pain level controlled ?Vital Signs Assessment: post-procedure vital signs reviewed and stable ?Respiratory status: spontaneous breathing and respiratory function stable ?Cardiovascular status: blood pressure returned to baseline and stable ?Postop Assessment: no headache and no apparent nausea or vomiting ?Anesthetic complications: no ?Comments: Late entry ? ? ?No notable events documented. ? ? ?Last Vitals:  ?Vitals:  ? 04/19/21 0815 04/19/21 0938  ?BP: (!) 151/81 117/61  ?Pulse: 94 86  ?Resp: 14 16  ?Temp:  36.6 ?C  ?SpO2: 98% 98%  ?  ?Last Pain:  ?Vitals:  ? 04/19/21 0938  ?TempSrc: Axillary  ?PainSc:   ? ? ?  ?  ?  ?  ?  ?  ? ?Louann Sjogren ? ? ? ? ?

## 2021-04-19 NOTE — Progress Notes (Addendum)
When patient was placed on the EKG monitor SVT rate of 186, denies chest pain, very anxious,BP 128/91. Dr. Briant Cedar in to see patient. ? ?1980 patient converted to sinus rhythm  with a rate of 96. Dr. Briant Cedar is aware, continues to not complain of any chest pain. BP 148/79 ?

## 2021-04-19 NOTE — Transfer of Care (Signed)
Immediate Anesthesia Transfer of Care Note ? ?Patient: Nathan Sutton ? ?Procedure(s) Performed: COLONOSCOPY WITH PROPOFOL ?POLYPECTOMY ?BIOPSY ? ?Patient Location: Short Stay ? ?Anesthesia Type:General ? ?Level of Consciousness: awake ? ?Airway & Oxygen Therapy: Patient Spontanous Breathing ? ?Post-op Assessment: Report given to RN and Post -op Vital signs reviewed and stable ? ?Post vital signs: Reviewed and stable ? ?Last Vitals:  ?Vitals Value Taken Time  ?BP    ?Temp    ?Pulse    ?Resp    ?SpO2    ? ? ?Last Pain:  ?Vitals:  ? 04/19/21 0849  ?TempSrc:   ?PainSc: 0-No pain  ?   ? ?Patients Stated Pain Goal: 6 (04/19/21 0745) ? ?Complications: No notable events documented. ?

## 2021-04-19 NOTE — Anesthesia Preprocedure Evaluation (Signed)
Anesthesia Evaluation  ?Patient identified by MRN, date of birth, ID band ?Patient awake ? ? ? ?Reviewed: ?Allergy & Precautions, H&P , NPO status , Patient's Chart, lab work & pertinent test results, reviewed documented beta blocker date and time  ? ?Airway ?Mallampati: II ? ?TM Distance: >3 FB ?Neck ROM: full ? ? ? Dental ?no notable dental hx. ? ?  ?Pulmonary ?neg pulmonary ROS,  ?  ?Pulmonary exam normal ?breath sounds clear to auscultation ? ? ? ? ? ? Cardiovascular ?Exercise Tolerance: Good ?hypertension, negative cardio ROS ? ? ?Rhythm:regular Rate:Normal ? ? ?  ?Neuro/Psych ? Neuromuscular disease negative psych ROS  ? GI/Hepatic ?negative GI ROS, Neg liver ROS,   ?Endo/Other  ?negative endocrine ROSdiabetes ? Renal/GU ?negative Renal ROS  ?negative genitourinary ?  ?Musculoskeletal ? ? Abdominal ?  ?Peds ? Hematology ?negative hematology ROS ?(+)   ?Anesthesia Other Findings ? ? Reproductive/Obstetrics ?negative OB ROS ? ?  ? ? ? ? ? ? ? ? ? ? ? ? ? ?  ?  ? ? ? ? ? ? ? ? ?Anesthesia Physical ?Anesthesia Plan ? ?ASA: 2 ? ?Anesthesia Plan: General  ? ?Post-op Pain Management:   ? ?Induction:  ? ?PONV Risk Score and Plan: Propofol infusion ? ?Airway Management Planned:  ? ?Additional Equipment:  ? ?Intra-op Plan:  ? ?Post-operative Plan:  ? ?Informed Consent: I have reviewed the patients History and Physical, chart, labs and discussed the procedure including the risks, benefits and alternatives for the proposed anesthesia with the patient or authorized representative who has indicated his/her understanding and acceptance.  ? ? ? ?Dental Advisory Given ? ?Plan Discussed with: CRNA ? ?Anesthesia Plan Comments:   ? ? ? ? ? ? ?Anesthesia Quick Evaluation ? ?

## 2021-04-19 NOTE — Discharge Instructions (Addendum)
?  Colonoscopy ?Discharge Instructions ? ?Read the instructions outlined below and refer to this sheet in the next few weeks. These discharge instructions provide you with general information on caring for yourself after you leave the hospital. Your doctor may also give you specific instructions. While your treatment has been planned according to the most current medical practices available, unavoidable complications occasionally occur.  ? ?ACTIVITY ?You may resume your regular activity, but move at a slower pace for the next 24 hours.  ?Take frequent rest periods for the next 24 hours.  ?Walking will help get rid of the air and reduce the bloated feeling in your belly (abdomen).  ?No driving for 24 hours (because of the medicine (anesthesia) used during the test).   ?Do not sign any important legal documents or operate any machinery for 24 hours (because of the anesthesia used during the test).  ?NUTRITION ?Drink plenty of fluids.  ?You may resume your normal diet as instructed by your doctor.  ?Begin with a light meal and progress to your normal diet. Heavy or fried foods are harder to digest and may make you feel sick to your stomach (nauseated).  ?Avoid alcoholic beverages for 24 hours or as instructed.  ?MEDICATIONS ?You may resume your normal medications unless your doctor tells you otherwise.  ?WHAT YOU CAN EXPECT TODAY ?Some feelings of bloating in the abdomen.  ?Passage of more gas than usual.  ?Spotting of blood in your stool or on the toilet paper.  ?IF YOU HAD POLYPS REMOVED DURING THE COLONOSCOPY: ?No aspirin products for 7 days or as instructed.  ?No alcohol for 7 days or as instructed.  ?Eat a soft diet for the next 24 hours.  ?FINDING OUT THE RESULTS OF YOUR TEST ?Not all test results are available during your visit. If your test results are not back during the visit, make an appointment with your caregiver to find out the results. Do not assume everything is normal if you have not heard from your  caregiver or the medical facility. It is important for you to follow up on all of your test results.  ?SEEK IMMEDIATE MEDICAL ATTENTION IF: ?You have more than a spotting of blood in your stool.  ?Your belly is swollen (abdominal distention).  ?You are nauseated or vomiting.  ?You have a temperature over 101.  ?You have abdominal pain or discomfort that is severe or gets worse throughout the day.  ? ?You had 1 very large polyp in the right side your colon.  I removed this.  I also placed 3 metallic clips in the area to close the defect after polypectomy.  I want you to hold your Eliquis x2 more days.  Await pathology results my office will contact you.  I also took biopsies of the surrounding area as well.  Repeat colonoscopy timing will depend on pathology results. ? ?I hope you have a great rest of your week! ? ?Elon Alas. Abbey Chatters, D.O. ?Gastroenterology and Hepatology ?[facility] ? ?

## 2021-04-20 LAB — SURGICAL PATHOLOGY

## 2021-04-21 ENCOUNTER — Encounter (HOSPITAL_COMMUNITY): Payer: Self-pay | Admitting: Internal Medicine

## 2021-05-05 DIAGNOSIS — N189 Chronic kidney disease, unspecified: Secondary | ICD-10-CM | POA: Diagnosis not present

## 2021-05-05 DIAGNOSIS — E1165 Type 2 diabetes mellitus with hyperglycemia: Secondary | ICD-10-CM | POA: Diagnosis not present

## 2021-05-05 DIAGNOSIS — E782 Mixed hyperlipidemia: Secondary | ICD-10-CM | POA: Diagnosis not present

## 2021-05-05 DIAGNOSIS — I10 Essential (primary) hypertension: Secondary | ICD-10-CM | POA: Diagnosis not present

## 2021-05-05 DIAGNOSIS — E1129 Type 2 diabetes mellitus with other diabetic kidney complication: Secondary | ICD-10-CM | POA: Diagnosis not present

## 2021-05-05 DIAGNOSIS — E7849 Other hyperlipidemia: Secondary | ICD-10-CM | POA: Diagnosis not present

## 2021-05-05 DIAGNOSIS — E1143 Type 2 diabetes mellitus with diabetic autonomic (poly)neuropathy: Secondary | ICD-10-CM | POA: Diagnosis not present

## 2021-05-07 ENCOUNTER — Telehealth: Payer: Self-pay

## 2021-05-07 NOTE — Telephone Encounter (Signed)
Dr. Abbey Chatters, ?The pt called and had a question for you. He wants to know how or does having clips in your body affect a MRI? He said it was ok to call or MyChart him today. He even said next week will be ok too. ?

## 2021-05-10 DIAGNOSIS — E1129 Type 2 diabetes mellitus with other diabetic kidney complication: Secondary | ICD-10-CM | POA: Diagnosis not present

## 2021-05-10 DIAGNOSIS — E1165 Type 2 diabetes mellitus with hyperglycemia: Secondary | ICD-10-CM | POA: Diagnosis not present

## 2021-05-10 DIAGNOSIS — E7849 Other hyperlipidemia: Secondary | ICD-10-CM | POA: Diagnosis not present

## 2021-05-10 DIAGNOSIS — E1140 Type 2 diabetes mellitus with diabetic neuropathy, unspecified: Secondary | ICD-10-CM | POA: Diagnosis not present

## 2021-05-10 DIAGNOSIS — Z6828 Body mass index (BMI) 28.0-28.9, adult: Secondary | ICD-10-CM | POA: Diagnosis not present

## 2021-05-10 DIAGNOSIS — G629 Polyneuropathy, unspecified: Secondary | ICD-10-CM | POA: Diagnosis not present

## 2021-05-10 DIAGNOSIS — F5101 Primary insomnia: Secondary | ICD-10-CM | POA: Diagnosis not present

## 2021-05-11 NOTE — Telephone Encounter (Signed)
I called and addressed patient's concerns.  Thank you ?

## 2021-05-11 NOTE — Telephone Encounter (Signed)
noted 

## 2021-05-12 DIAGNOSIS — U071 COVID-19: Secondary | ICD-10-CM | POA: Diagnosis not present

## 2021-05-20 ENCOUNTER — Encounter (HOSPITAL_COMMUNITY): Payer: Self-pay | Admitting: Nurse Practitioner

## 2021-05-20 ENCOUNTER — Ambulatory Visit (HOSPITAL_COMMUNITY)
Admission: RE | Admit: 2021-05-20 | Discharge: 2021-05-20 | Disposition: A | Discharge status: Home / Self Care | Payer: Medicare Other | Source: Ambulatory Visit | Attending: Nurse Practitioner | Admitting: Nurse Practitioner

## 2021-05-20 VITALS — BP 168/76 | HR 69 | Ht 73.0 in | Wt 213.4 lb

## 2021-05-20 DIAGNOSIS — I4819 Other persistent atrial fibrillation: Secondary | ICD-10-CM | POA: Diagnosis not present

## 2021-05-20 DIAGNOSIS — E119 Type 2 diabetes mellitus without complications: Secondary | ICD-10-CM | POA: Insufficient documentation

## 2021-05-20 DIAGNOSIS — I110 Hypertensive heart disease with heart failure: Secondary | ICD-10-CM | POA: Insufficient documentation

## 2021-05-20 DIAGNOSIS — Z8673 Personal history of transient ischemic attack (TIA), and cerebral infarction without residual deficits: Secondary | ICD-10-CM | POA: Insufficient documentation

## 2021-05-20 DIAGNOSIS — I480 Paroxysmal atrial fibrillation: Secondary | ICD-10-CM | POA: Diagnosis not present

## 2021-05-20 DIAGNOSIS — I509 Heart failure, unspecified: Secondary | ICD-10-CM | POA: Diagnosis not present

## 2021-05-20 DIAGNOSIS — Z6828 Body mass index (BMI) 28.0-28.9, adult: Secondary | ICD-10-CM | POA: Insufficient documentation

## 2021-05-20 DIAGNOSIS — D6869 Other thrombophilia: Secondary | ICD-10-CM

## 2021-05-20 DIAGNOSIS — E663 Overweight: Secondary | ICD-10-CM | POA: Diagnosis not present

## 2021-05-20 DIAGNOSIS — I4892 Unspecified atrial flutter: Secondary | ICD-10-CM | POA: Diagnosis not present

## 2021-05-20 DIAGNOSIS — Z7901 Long term (current) use of anticoagulants: Secondary | ICD-10-CM | POA: Diagnosis not present

## 2021-05-20 NOTE — Progress Notes (Signed)
Primary Care Physician: Manon Hilding, MD Primary Electrophysiologist: Dr Rayann Heman Referring Physician: Dr Guadalupe Dawn is a 71 y.o. male with a history of paroxysmal atrial fibrillation and atrial flutter who presents for follow up in the [facility]. Patient reports that he has done well since his ablation in 2019 x 2  with no symptoms of heart racing or palpitations. He is in SR by ekg today. He remains very active by walking daily and doing yard work. He does have neuropathy.  BP elevated this am, travels form [place],  but states at home, BP not usually over 782 systolic. States that he had a colonoscopy in April and had a large polyp removed.  Today, he denies symptoms of palpitations, chest pain, shortness of breath, orthopnea, PND, dizziness, presyncope, syncope, snoring, daytime somnolence, bleeding, or neurologic sequela. The patient is tolerating medications without difficulties and is otherwise without complaint today.    Atrial Fibrillation Risk Factors:  he does not have symptoms or diagnosis of sleep apnea. he does not have a history of alcohol use. The patient does not have a history of early familial atrial fibrillation or other arrhythmias.  he has a BMI of Body mass index is 28.15 kg/m.Marland Kitchen Filed Weights   05/20/21 1047  Weight: 96.8 kg    Family History  Problem Relation Age of Onset   Heart failure Mother    Stroke Father      Atrial Fibrillation Management history:  Previous antiarrhythmic drugs: flecainide Previous cardioversions: 08/11/17 Previous ablations: 02/2017, 10/2017 CHADS2VASC score: 3 Anticoagulation history: Eliquis    Past Medical History:  Diagnosis Date   Atrial fibrillation ([place])    Diabetic neuropathy ([place])    Dyslipidemia    Ejection fraction    EF 55-60%, echo, May, 2014   Heart murmur    As a Child   Hypertension    Overweight(278.02)    Peripheral neuropathy    Type II diabetes mellitus  ([place])    Past Surgical History:  Procedure Laterality Date   ATRIAL FIBRILLATION ABLATION N/A 03/02/2017   Procedure: ATRIAL FIBRILLATION ABLATION;  Surgeon: Thompson Grayer, MD;  Location: [place] [facility];  Service: Cardiovascular;  Laterality: N/A;   ATRIAL FIBRILLATION ABLATION N/A 10/13/2017   Procedure: ATRIAL FIBRILLATION ABLATION;  Surgeon: Thompson Grayer, MD;  Location: [place] [facility];  Service: Cardiovascular;  Laterality: N/A;   BACK SURGERY     BIOPSY  04/19/2021   Procedure: BIOPSY;  Surgeon: Eloise Harman, DO;  [facility];  Service: Endoscopy;;   CARDIOVERSION N/A 08/11/2017   Procedure: CARDIOVERSION;  Surgeon: Fay Records, MD;  [facility];  Service: Cardiovascular;  Laterality: N/A;   COLONOSCOPY WITH PROPOFOL N/A 04/19/2021   Procedure: COLONOSCOPY WITH PROPOFOL;  Surgeon: Eloise Harman, DO;  [facility];  Service: Endoscopy;  Laterality: N/A;  8:30am   DECOMPRESSIVE LUMBAR LAMINECTOMY LEVEL 2 Right 08/2009   L3-4; L4-5/notes 08/22/2009   POLYPECTOMY  04/19/2021   Procedure: POLYPECTOMY;  Surgeon: Eloise Harman, DO;  [facility];  Service: Endoscopy;;   TEE WITHOUT CARDIOVERSION N/A 08/11/2017   Procedure: TRANSESOPHAGEAL ECHOCARDIOGRAM (TEE) WITH PROPOFOL;  Surgeon: Fay Records, MD;  Location: [facility];  Service: Cardiovascular;  Laterality: N/A;    Current Outpatient Medications  Medication Sig Dispense Refill   acetaminophen (TYLENOL) 500 MG tablet Take 500-1,000 mg by mouth every 6 (six) hours as needed for moderate pain.  amLODipine (NORVASC) 10 MG tablet TAKE 1 TABLET DAILY 90 tablet 3   apixaban (ELIQUIS) 5 MG TABS tablet Take 1 tablet (5 mg total) by mouth 2 (two) times daily. 180 tablet 3   atorvastatin (LIPITOR) 10 MG tablet Take 5 mg by mouth every Monday, Wednesday, and Friday.      Azelastine HCl 137 MCG/SPRAY SOLN Place 1 spray into both nostrils daily as needed (allergies).     Blood Pressure  Monitoring (BLOOD PRESSURE MONITOR AUTOMAT) DEVI 1 each by Does not apply route as directed. Large cuff digital Blood Pressure Monitor Dx: hypertensive heart disease 1 each 0   cetirizine (ZYRTEC) 10 MG tablet Take 5-10 mg by mouth daily as needed for allergies.      cyclobenzaprine (FLEXERIL) 10 MG tablet Take 10 mg by mouth daily as needed for muscle spasms.     fluticasone (FLONASE) 50 MCG/ACT nasal spray Place 2 sprays into both nostrils daily as needed for allergies.     gabapentin (NEURONTIN) 300 MG capsule Take 300-600 mg by mouth See admin instructions. 300 mg in the evening, 600 mg at bedtime     lisinopril-hydrochlorothiazide (PRINZIDE,ZESTORETIC) 20-12.5 MG per tablet Take 2 tablets by mouth daily.      metFORMIN (GLUCOPHAGE-XR) 500 MG 24 hr tablet Take 1,000 mg by mouth 2 (two) times daily.   1   metoprolol succinate (TOPROL-XL) 50 MG 24 hr tablet TAKE 1 TABLET DAILY (DOSE  CHANGE 08/09/17) 30 tablet 0   pantoprazole (PROTONIX) 40 MG tablet Take 1 tablet by mouth twice daily 180 tablet 0   No current facility-administered medications for this encounter.    No Known Allergies  Social History   Socioeconomic History   Marital status: Married    Spouse name: Not on file   Number of children: Not on file   Years of education: Not on file   Highest education level: Not on file  Occupational History   Not on file  Tobacco Use   Smoking status: Never   Smokeless tobacco: Never  Vaping Use   Vaping Use: Never used  Substance and Sexual Activity   Alcohol use: No    Alcohol/week: 0.0 standard drinks   Drug use: No   Sexual activity: Not Currently  Other Topics Concern   Not on file  Social History Narrative   Not on file   Social Determinants of Health   Financial Resource Strain: Not on file  Food Insecurity: Not on file  Transportation Needs: Not on file  Physical Activity: Not on file  Stress: Not on file  Social Connections: Not on file  Intimate Partner  Violence: Not on file     ROS- All systems are reviewed and negative except as per the HPI above.  Physical Exam: Vitals:   05/20/21 1047  Weight: 96.8 kg  Height: '6\' 1"'$  (1.854 m)    GEN- The patient is well appearing, alert and oriented x 3 today.   Head- normocephalic, atraumatic Eyes-  Sclera clear, conjunctiva pink Ears- hearing intact Oropharynx- clear Neck- supple  Lungs- Clear to ausculation bilaterally, normal work of breathing Heart- Regular rate and rhythm, no murmurs, rubs or gallops  GI- soft, NT, ND, + BS Extremities- no clubbing, cyanosis. 1+ edema R, trace edema L. MS- no significant deformity or atrophy Skin- no rash or lesion Psych- euthymic mood, full affect Neuro- strength and sensation are intact  Wt Readings from Last 3 Encounters:  05/20/21 96.8 kg  04/19/21 97 kg  04/15/21  97.1 kg    EKG Vent. rate 69 BPM PR interval 168 ms QRS duration 84 ms QT/QTcB 372/398 ms P-R-T axes 73 -2 52 Normal sinus rhythm Normal ECG When compared with ECG of 07-Nov-2018 09:06, PREVIOUS ECG IS PRESENT  Echo 06/23/16 demonstrated  - Left ventricle: The cavity size was normal. Wall thickness was   increased in a pattern of mild LVH. Systolic function was normal.   The estimated ejection fraction was in the range of 60% to 65%.   Left ventricular diastolic function parameters were normal. - Aortic valve: AV is thickened with minimally restricted motion. - Mitral valve: There was mild regurgitation.  Epic records are reviewed at length today  Assessment and Plan:  1. Paroxysmal atrial fibrillation/atrial flutter S/p ablations 02/2017 and 10/2017. Doing well with no symptoms, appears to be maintaining SR Continue Eliquis 5 mg BID Continue Toprol 50 mg daily  This patients CHA2DS2-VASc Score and unadjusted Ischemic Stroke Rate (% per year) is equal to 3.2 % stroke rate/year from a score of 3  Above score calculated as 1 point each if present [CHF, HTN, DM,  Vascular=MI/PAD/Aortic Plaque, Age if 65-74, or Male] Above score calculated as 2 points each if present [Age > 75, or Stroke/TIA/TE]  2. Overweight Regular exercise encouraged   3. HTN Stable at home   F/u in afib clinic in one year with Adline Peals, PA  Butch Penny C. [facility] [address] [place], [place] [address] [phone]  05/20/2021 11:00 AM

## 2021-08-12 DIAGNOSIS — H40013 Open angle with borderline findings, low risk, bilateral: Secondary | ICD-10-CM | POA: Diagnosis not present

## 2021-09-09 DIAGNOSIS — Z6829 Body mass index (BMI) 29.0-29.9, adult: Secondary | ICD-10-CM | POA: Diagnosis not present

## 2021-09-09 DIAGNOSIS — R42 Dizziness and giddiness: Secondary | ICD-10-CM | POA: Diagnosis not present

## 2021-09-24 ENCOUNTER — Other Ambulatory Visit: Payer: Self-pay | Admitting: Internal Medicine

## 2021-10-05 DIAGNOSIS — E1165 Type 2 diabetes mellitus with hyperglycemia: Secondary | ICD-10-CM | POA: Diagnosis not present

## 2021-10-05 DIAGNOSIS — E782 Mixed hyperlipidemia: Secondary | ICD-10-CM | POA: Diagnosis not present

## 2021-10-05 DIAGNOSIS — N189 Chronic kidney disease, unspecified: Secondary | ICD-10-CM | POA: Diagnosis not present

## 2021-10-05 DIAGNOSIS — I10 Essential (primary) hypertension: Secondary | ICD-10-CM | POA: Diagnosis not present

## 2021-10-05 DIAGNOSIS — E7800 Pure hypercholesterolemia, unspecified: Secondary | ICD-10-CM | POA: Diagnosis not present

## 2021-10-05 DIAGNOSIS — E1140 Type 2 diabetes mellitus with diabetic neuropathy, unspecified: Secondary | ICD-10-CM | POA: Diagnosis not present

## 2021-10-07 DIAGNOSIS — R800 Isolated proteinuria: Secondary | ICD-10-CM | POA: Diagnosis not present

## 2021-10-07 DIAGNOSIS — E1165 Type 2 diabetes mellitus with hyperglycemia: Secondary | ICD-10-CM | POA: Diagnosis not present

## 2021-10-07 DIAGNOSIS — I482 Chronic atrial fibrillation, unspecified: Secondary | ICD-10-CM | POA: Diagnosis not present

## 2021-10-07 DIAGNOSIS — E7849 Other hyperlipidemia: Secondary | ICD-10-CM | POA: Diagnosis not present

## 2021-10-07 DIAGNOSIS — E782 Mixed hyperlipidemia: Secondary | ICD-10-CM | POA: Diagnosis not present

## 2021-10-07 DIAGNOSIS — Z0001 Encounter for general adult medical examination with abnormal findings: Secondary | ICD-10-CM | POA: Diagnosis not present

## 2021-10-07 DIAGNOSIS — E1129 Type 2 diabetes mellitus with other diabetic kidney complication: Secondary | ICD-10-CM | POA: Diagnosis not present

## 2021-10-07 DIAGNOSIS — E1140 Type 2 diabetes mellitus with diabetic neuropathy, unspecified: Secondary | ICD-10-CM | POA: Diagnosis not present

## 2021-10-07 DIAGNOSIS — F5101 Primary insomnia: Secondary | ICD-10-CM | POA: Diagnosis not present

## 2021-10-07 DIAGNOSIS — Z23 Encounter for immunization: Secondary | ICD-10-CM | POA: Diagnosis not present

## 2021-10-07 DIAGNOSIS — G629 Polyneuropathy, unspecified: Secondary | ICD-10-CM | POA: Diagnosis not present

## 2021-10-07 DIAGNOSIS — Z6829 Body mass index (BMI) 29.0-29.9, adult: Secondary | ICD-10-CM | POA: Diagnosis not present

## 2021-10-26 DIAGNOSIS — Z23 Encounter for immunization: Secondary | ICD-10-CM | POA: Diagnosis not present

## 2021-11-10 DIAGNOSIS — Z23 Encounter for immunization: Secondary | ICD-10-CM | POA: Diagnosis not present

## 2021-12-16 DIAGNOSIS — R269 Unspecified abnormalities of gait and mobility: Secondary | ICD-10-CM | POA: Diagnosis not present

## 2021-12-16 DIAGNOSIS — R42 Dizziness and giddiness: Secondary | ICD-10-CM | POA: Diagnosis not present

## 2021-12-16 DIAGNOSIS — E1143 Type 2 diabetes mellitus with diabetic autonomic (poly)neuropathy: Secondary | ICD-10-CM | POA: Diagnosis not present

## 2021-12-16 DIAGNOSIS — I482 Chronic atrial fibrillation, unspecified: Secondary | ICD-10-CM | POA: Diagnosis not present

## 2021-12-16 DIAGNOSIS — Z6829 Body mass index (BMI) 29.0-29.9, adult: Secondary | ICD-10-CM | POA: Diagnosis not present

## 2021-12-16 DIAGNOSIS — G629 Polyneuropathy, unspecified: Secondary | ICD-10-CM | POA: Diagnosis not present

## 2021-12-20 ENCOUNTER — Encounter: Payer: Self-pay | Admitting: Neurology

## 2022-02-02 ENCOUNTER — Ambulatory Visit: Payer: Medicare Other | Admitting: Neurology

## 2022-02-07 DIAGNOSIS — E1140 Type 2 diabetes mellitus with diabetic neuropathy, unspecified: Secondary | ICD-10-CM | POA: Diagnosis not present

## 2022-02-07 DIAGNOSIS — E1129 Type 2 diabetes mellitus with other diabetic kidney complication: Secondary | ICD-10-CM | POA: Diagnosis not present

## 2022-02-07 DIAGNOSIS — I10 Essential (primary) hypertension: Secondary | ICD-10-CM | POA: Diagnosis not present

## 2022-02-07 DIAGNOSIS — E7801 Familial hypercholesterolemia: Secondary | ICD-10-CM | POA: Diagnosis not present

## 2022-02-07 DIAGNOSIS — E7849 Other hyperlipidemia: Secondary | ICD-10-CM | POA: Diagnosis not present

## 2022-02-10 ENCOUNTER — Encounter (HOSPITAL_COMMUNITY): Payer: Self-pay | Admitting: *Deleted

## 2022-02-10 DIAGNOSIS — I482 Chronic atrial fibrillation, unspecified: Secondary | ICD-10-CM | POA: Diagnosis not present

## 2022-02-10 DIAGNOSIS — R030 Elevated blood-pressure reading, without diagnosis of hypertension: Secondary | ICD-10-CM | POA: Diagnosis not present

## 2022-02-10 DIAGNOSIS — E1129 Type 2 diabetes mellitus with other diabetic kidney complication: Secondary | ICD-10-CM | POA: Diagnosis not present

## 2022-02-10 DIAGNOSIS — G629 Polyneuropathy, unspecified: Secondary | ICD-10-CM | POA: Diagnosis not present

## 2022-02-10 DIAGNOSIS — E1165 Type 2 diabetes mellitus with hyperglycemia: Secondary | ICD-10-CM | POA: Diagnosis not present

## 2022-02-10 DIAGNOSIS — E7849 Other hyperlipidemia: Secondary | ICD-10-CM | POA: Diagnosis not present

## 2022-02-10 DIAGNOSIS — Z6829 Body mass index (BMI) 29.0-29.9, adult: Secondary | ICD-10-CM | POA: Diagnosis not present

## 2022-02-10 DIAGNOSIS — E1140 Type 2 diabetes mellitus with diabetic neuropathy, unspecified: Secondary | ICD-10-CM | POA: Diagnosis not present

## 2022-02-10 DIAGNOSIS — R800 Isolated proteinuria: Secondary | ICD-10-CM | POA: Diagnosis not present

## 2022-02-14 DIAGNOSIS — H35363 Drusen (degenerative) of macula, bilateral: Secondary | ICD-10-CM | POA: Diagnosis not present

## 2022-02-15 ENCOUNTER — Ambulatory Visit: Payer: Medicare Other | Admitting: Neurology

## 2022-05-23 ENCOUNTER — Ambulatory Visit (HOSPITAL_COMMUNITY)
Admission: RE | Admit: 2022-05-23 | Discharge: 2022-05-23 | Disposition: A | Discharge status: Home / Self Care | Payer: Medicare Other | Source: Ambulatory Visit | Attending: Physician Assistant | Admitting: Physician Assistant

## 2022-05-23 VITALS — BP 152/84 | HR 75 | Ht 73.0 in | Wt 225.2 lb

## 2022-05-23 DIAGNOSIS — I480 Paroxysmal atrial fibrillation: Secondary | ICD-10-CM | POA: Diagnosis not present

## 2022-05-23 DIAGNOSIS — D6869 Other thrombophilia: Secondary | ICD-10-CM | POA: Diagnosis not present

## 2022-05-23 NOTE — Progress Notes (Signed)
Primary Care Physician: Estanislado Pandy, MD Primary Electrophysiologist: Dr Johney Frame Referring Physician: Dr Llana Aliment is a 72 y.o. male with a history of HTN, DM, atrial fibrillation, atrial flutter who presents for follow up in the [facility]. Patient is s/p ablation in 2019 x 2.    On follow up today, patient reports that he has done well from an afib standpoint. He has not had any interim episodes of tachypalpitations. No recent bleeding issues on anticoagulation.   Today, he denies symptoms of palpitations, chest pain, shortness of breath, orthopnea, PND, dizziness, presyncope, syncope, snoring, daytime somnolence, bleeding, or neurologic sequela. The patient is tolerating medications without difficulties and is otherwise without complaint today.    Atrial Fibrillation Risk Factors:  he does not have symptoms or diagnosis of sleep apnea. he does not have a history of alcohol use. The patient does not have a history of early familial atrial fibrillation or other arrhythmias.  he has a BMI of Body mass index is 29.71 kg/m.Marland Kitchen Filed Weights   05/23/22 1137  Weight: 102.2 kg   Family History  Problem Relation Age of Onset   Heart failure Mother    Stroke Father      Atrial Fibrillation Management history:  Previous antiarrhythmic drugs: flecainide Previous cardioversions: 08/11/17 Previous ablations: 02/2017, 10/2017 Anticoagulation history: Eliquis    Past Medical History:  Diagnosis Date   Atrial fibrillation ([facility])    Diabetic neuropathy ([facility])    Dyslipidemia    Ejection fraction    EF 55-60%, echo, May, 2014   Heart murmur    As a Child   Hypertension    Overweight(278.02)    Peripheral neuropathy    Type II diabetes mellitus ([facility])    Past Surgical History:  Procedure Laterality Date   ATRIAL FIBRILLATION ABLATION N/A 03/02/2017   Procedure: ATRIAL FIBRILLATION ABLATION;  Surgeon: Hillis Range, MD;  [facility];  Service: Cardiovascular;  Laterality: N/A;   ATRIAL FIBRILLATION ABLATION N/A 10/13/2017   Procedure: ATRIAL FIBRILLATION ABLATION;  Surgeon: Hillis Range, MD;  Location: [facility];  Service: Cardiovascular;  Laterality: N/A;   BACK SURGERY     BIOPSY  04/19/2021   Procedure: BIOPSY;  Surgeon: Lanelle Bal, DO;  [facility];  Service: Endoscopy;;   CARDIOVERSION N/A 08/11/2017   Procedure: CARDIOVERSION;  Surgeon: Pricilla Riffle, MD;  [facility];  Service: Cardiovascular;  Laterality: N/A;   COLONOSCOPY WITH PROPOFOL N/A 04/19/2021   Procedure: COLONOSCOPY WITH PROPOFOL;  Surgeon: Lanelle Bal, DO;  [facility];  Service: Endoscopy;  Laterality: N/A;  8:30am   DECOMPRESSIVE LUMBAR LAMINECTOMY LEVEL 2 Right 08/2009   L3-4; L4-5/notes 08/22/2009   POLYPECTOMY  04/19/2021   Procedure: POLYPECTOMY;  Surgeon: Lanelle Bal, DO;  [facility];  Service: Endoscopy;;   TEE WITHOUT CARDIOVERSION N/A 08/11/2017   Procedure: TRANSESOPHAGEAL ECHOCARDIOGRAM (TEE) WITH PROPOFOL;  Surgeon: Pricilla Riffle, MD;  [facility]: [facility];  Service: Cardiovascular;  Laterality: N/A;    Current Outpatient Medications  Medication Sig Dispense Refill   acetaminophen (TYLENOL) 500 MG tablet Take 500-1,000 mg by mouth every 6 (six) hours as needed for moderate pain.     amLODipine (NORVASC) 10 MG tablet TAKE 1 TABLET DAILY 90 tablet 1   apixaban (ELIQUIS) 5 MG TABS tablet Take 1 tablet (5 mg total) by mouth 2 (two) times daily. 180 tablet 3  atorvastatin (LIPITOR) 10 MG tablet Take 5 mg by mouth every Monday, Wednesday, and Friday.      Azelastine HCl 137 MCG/SPRAY SOLN Place 1 spray into both nostrils daily as needed (allergies).     Blood Pressure Monitoring (BLOOD PRESSURE MONITOR AUTOMAT) DEVI 1 each by Does not apply route as directed. Large cuff digital Blood Pressure Monitor Dx: hypertensive heart disease 1 each 0   cetirizine (ZYRTEC) 10 MG  tablet Take 5-10 mg by mouth daily as needed for allergies.      cyclobenzaprine (FLEXERIL) 10 MG tablet Take 10 mg by mouth daily as needed for muscle spasms.     fluticasone (FLONASE) 50 MCG/ACT nasal spray Place 2 sprays into both nostrils daily as needed for allergies.     gabapentin (NEURONTIN) 300 MG capsule Take 300-600 mg by mouth See admin instructions. 300 mg in the evening, 600 mg at bedtime     lisinopril-hydrochlorothiazide (PRINZIDE,ZESTORETIC) 20-12.5 MG per tablet Take 2 tablets by mouth daily.      meclizine (ANTIVERT) 25 MG tablet SMARTSIG:0.5-1 Tablet(s) By Mouth Every 6 Hours     metFORMIN (GLUCOPHAGE-XR) 500 MG 24 hr tablet Take 1,000 mg by mouth 2 (two) times daily.   1   metoprolol succinate (TOPROL-XL) 50 MG 24 hr tablet TAKE 1 TABLET DAILY (DOSE  CHANGE 08/09/17) 30 tablet 0   pantoprazole (PROTONIX) 40 MG tablet Take 1 tablet by mouth twice daily 180 tablet 0   No current facility-administered medications for this encounter.    No Known Allergies  Social History   Socioeconomic History   Marital status: Married    Spouse name: Not on file   Number of children: Not on file   Years of education: Not on file   Highest education level: Not on file  Occupational History   Not on file  Tobacco Use   Smoking status: Never   Smokeless tobacco: Never  Vaping Use   Vaping Use: Never used  Substance and Sexual Activity   Alcohol use: No    Alcohol/week: 0.0 standard drinks of alcohol   Drug use: No   Sexual activity: Not Currently  Other Topics Concern   Not on file  Social History Narrative   Not on file   Social Determinants of Health   Financial Resource Strain: Not on file  Food Insecurity: Not on file  Transportation Needs: Not on file  Physical Activity: Not on file  Stress: Not on file  Social Connections: Unknown (08/11/2017)   Social Connection and Isolation Panel [NHANES]    Frequency of Communication with Friends and Family: Patient declined     Frequency of Social Gatherings with Friends and Family: Patient declined    Attends Religious Services: Patient declined    Database administrator or Organizations: Patient declined    Attends Banker Meetings: Patient declined    Marital Status: Patient declined  Intimate Partner Violence: Unknown (08/11/2017)   Humiliation, Afraid, Rape, and Kick questionnaire    Fear of Current or Ex-Partner: Patient declined    Emotionally Abused: Patient declined    Physically Abused: Patient declined    Sexually Abused: Patient declined     ROS- All systems are reviewed and negative except as per the HPI above.  Physical Exam: Vitals:   05/23/22 1137  BP: (!) 152/84  Pulse: 75  Weight: 102.2 kg  Height: 6\' 1"  (1.854 m)     GEN- The patient is a well appearing male, alert and oriented  x 3 today.   HEENT-head normocephalic, atraumatic, sclera clear, conjunctiva pink, hearing intact, trachea midline. Lungs- Clear to ausculation bilaterally, normal work of breathing Heart- Regular rate and rhythm, no murmurs, rubs or gallops  GI- soft, NT, ND, + BS Extremities- no clubbing, cyanosis, or edema MS- no significant deformity or atrophy Skin- no rash or lesion Psych- euthymic mood, full affect Neuro- strength and sensation are intact   Wt Readings from Last 3 Encounters:  05/23/22 102.2 kg  05/20/21 96.8 kg  04/19/21 97 kg    EKG today demonstrates SR Vent. rate 75 BPM PR interval 164 ms QRS duration 82 ms QT/QTcB 374/417 ms   Echo 06/23/16 demonstrated  - Left ventricle: The cavity size was normal. Wall thickness was   increased in a pattern of mild LVH. Systolic function was normal.   The estimated ejection fraction was in the range of 60% to 65%.   Left ventricular diastolic function parameters were normal. - Aortic valve: AV is thickened with minimally restricted motion. - Mitral valve: There was mild regurgitation.  Epic records are reviewed at length  today   CHA2DS2-VASc Score = 3  The patient's score is based upon: CHF History: 0 HTN History: 1 Diabetes History: 1 Stroke History: 0 Vascular Disease History: 0 Age Score: 1 Gender Score: 0       ASSESSMENT AND PLAN: 1. Paroxysmal Atrial Fibrillation/atrial flutter The patient's CHA2DS2-VASc score is 3, indicating a 3.2% annual risk of stroke.   S/p ablations 02/2017 and 10/2017. Patient appears to be maintaining SR.  Continue Eliquis 5 mg BID Continue Toprol 50 mg daily  2. Secondary Hypercoagulable State (ICD10:  D68.69) The patient is at significant risk for stroke/thromboembolism based upon his CHA2DS2-VASc Score of 3.  Continue Apixaban (Eliquis).   3. HTN Initially elevated, better on recheck. Within normal range at home. No changes today.    Follow up in the AF clinic in one year.    Jorja Loa PA-C Af[facility] [address] [place], [place] [address] [phone]  05/23/2022 12:29 PM

## 2022-06-23 DIAGNOSIS — I10 Essential (primary) hypertension: Secondary | ICD-10-CM | POA: Diagnosis not present

## 2022-06-23 DIAGNOSIS — E1129 Type 2 diabetes mellitus with other diabetic kidney complication: Secondary | ICD-10-CM | POA: Diagnosis not present

## 2022-06-23 DIAGNOSIS — E1140 Type 2 diabetes mellitus with diabetic neuropathy, unspecified: Secondary | ICD-10-CM | POA: Diagnosis not present

## 2022-06-23 DIAGNOSIS — N189 Chronic kidney disease, unspecified: Secondary | ICD-10-CM | POA: Diagnosis not present

## 2022-06-23 DIAGNOSIS — E7849 Other hyperlipidemia: Secondary | ICD-10-CM | POA: Diagnosis not present

## 2022-06-30 DIAGNOSIS — E1140 Type 2 diabetes mellitus with diabetic neuropathy, unspecified: Secondary | ICD-10-CM | POA: Diagnosis not present

## 2022-06-30 DIAGNOSIS — R030 Elevated blood-pressure reading, without diagnosis of hypertension: Secondary | ICD-10-CM | POA: Diagnosis not present

## 2022-06-30 DIAGNOSIS — R800 Isolated proteinuria: Secondary | ICD-10-CM | POA: Diagnosis not present

## 2022-06-30 DIAGNOSIS — G4700 Insomnia, unspecified: Secondary | ICD-10-CM | POA: Diagnosis not present

## 2022-06-30 DIAGNOSIS — E1129 Type 2 diabetes mellitus with other diabetic kidney complication: Secondary | ICD-10-CM | POA: Diagnosis not present

## 2022-06-30 DIAGNOSIS — E7849 Other hyperlipidemia: Secondary | ICD-10-CM | POA: Diagnosis not present

## 2022-06-30 DIAGNOSIS — Z23 Encounter for immunization: Secondary | ICD-10-CM | POA: Diagnosis not present

## 2022-06-30 DIAGNOSIS — Z6829 Body mass index (BMI) 29.0-29.9, adult: Secondary | ICD-10-CM | POA: Diagnosis not present

## 2022-06-30 DIAGNOSIS — E1165 Type 2 diabetes mellitus with hyperglycemia: Secondary | ICD-10-CM | POA: Diagnosis not present

## 2022-06-30 DIAGNOSIS — I482 Chronic atrial fibrillation, unspecified: Secondary | ICD-10-CM | POA: Diagnosis not present

## 2022-06-30 DIAGNOSIS — G629 Polyneuropathy, unspecified: Secondary | ICD-10-CM | POA: Diagnosis not present

## 2022-10-13 DIAGNOSIS — E7849 Other hyperlipidemia: Secondary | ICD-10-CM | POA: Diagnosis not present

## 2022-10-13 DIAGNOSIS — N189 Chronic kidney disease, unspecified: Secondary | ICD-10-CM | POA: Diagnosis not present

## 2022-10-13 DIAGNOSIS — E1022 Type 1 diabetes mellitus with diabetic chronic kidney disease: Secondary | ICD-10-CM | POA: Diagnosis not present

## 2022-10-20 DIAGNOSIS — Z23 Encounter for immunization: Secondary | ICD-10-CM | POA: Diagnosis not present

## 2022-10-20 DIAGNOSIS — R030 Elevated blood-pressure reading, without diagnosis of hypertension: Secondary | ICD-10-CM | POA: Diagnosis not present

## 2022-10-20 DIAGNOSIS — E1121 Type 2 diabetes mellitus with diabetic nephropathy: Secondary | ICD-10-CM | POA: Diagnosis not present

## 2022-10-20 DIAGNOSIS — G629 Polyneuropathy, unspecified: Secondary | ICD-10-CM | POA: Diagnosis not present

## 2022-10-20 DIAGNOSIS — R800 Isolated proteinuria: Secondary | ICD-10-CM | POA: Diagnosis not present

## 2022-10-20 DIAGNOSIS — E1129 Type 2 diabetes mellitus with other diabetic kidney complication: Secondary | ICD-10-CM | POA: Diagnosis not present

## 2022-10-20 DIAGNOSIS — E1142 Type 2 diabetes mellitus with diabetic polyneuropathy: Secondary | ICD-10-CM | POA: Diagnosis not present

## 2022-10-20 DIAGNOSIS — I482 Chronic atrial fibrillation, unspecified: Secondary | ICD-10-CM | POA: Diagnosis not present

## 2022-10-20 DIAGNOSIS — E1140 Type 2 diabetes mellitus with diabetic neuropathy, unspecified: Secondary | ICD-10-CM | POA: Diagnosis not present

## 2022-10-20 DIAGNOSIS — E1165 Type 2 diabetes mellitus with hyperglycemia: Secondary | ICD-10-CM | POA: Diagnosis not present

## 2022-10-20 DIAGNOSIS — E7849 Other hyperlipidemia: Secondary | ICD-10-CM | POA: Diagnosis not present

## 2022-11-08 DIAGNOSIS — Z23 Encounter for immunization: Secondary | ICD-10-CM | POA: Diagnosis not present

## 2023-02-15 DIAGNOSIS — E1129 Type 2 diabetes mellitus with other diabetic kidney complication: Secondary | ICD-10-CM | POA: Diagnosis not present

## 2023-02-17 DIAGNOSIS — Z13220 Encounter for screening for lipoid disorders: Secondary | ICD-10-CM | POA: Diagnosis not present

## 2023-02-17 DIAGNOSIS — E1121 Type 2 diabetes mellitus with diabetic nephropathy: Secondary | ICD-10-CM | POA: Diagnosis not present

## 2023-02-17 DIAGNOSIS — N1831 Chronic kidney disease, stage 3a: Secondary | ICD-10-CM | POA: Diagnosis not present

## 2023-02-17 DIAGNOSIS — E1129 Type 2 diabetes mellitus with other diabetic kidney complication: Secondary | ICD-10-CM | POA: Diagnosis not present

## 2023-02-17 DIAGNOSIS — E1142 Type 2 diabetes mellitus with diabetic polyneuropathy: Secondary | ICD-10-CM | POA: Diagnosis not present

## 2023-02-17 DIAGNOSIS — E782 Mixed hyperlipidemia: Secondary | ICD-10-CM | POA: Diagnosis not present

## 2023-02-21 DIAGNOSIS — H35033 Hypertensive retinopathy, bilateral: Secondary | ICD-10-CM | POA: Diagnosis not present

## 2023-02-23 DIAGNOSIS — E6609 Other obesity due to excess calories: Secondary | ICD-10-CM | POA: Diagnosis not present

## 2023-02-23 DIAGNOSIS — E782 Mixed hyperlipidemia: Secondary | ICD-10-CM | POA: Diagnosis not present

## 2023-02-23 DIAGNOSIS — E7849 Other hyperlipidemia: Secondary | ICD-10-CM | POA: Diagnosis not present

## 2023-02-23 DIAGNOSIS — G629 Polyneuropathy, unspecified: Secondary | ICD-10-CM | POA: Diagnosis not present

## 2023-02-23 DIAGNOSIS — E1142 Type 2 diabetes mellitus with diabetic polyneuropathy: Secondary | ICD-10-CM | POA: Diagnosis not present

## 2023-02-23 DIAGNOSIS — Z23 Encounter for immunization: Secondary | ICD-10-CM | POA: Diagnosis not present

## 2023-02-23 DIAGNOSIS — Z6829 Body mass index (BMI) 29.0-29.9, adult: Secondary | ICD-10-CM | POA: Diagnosis not present

## 2023-05-25 ENCOUNTER — Encounter (HOSPITAL_COMMUNITY): Payer: Self-pay | Admitting: Physician Assistant

## 2023-05-25 ENCOUNTER — Ambulatory Visit (HOSPITAL_COMMUNITY)
Admission: RE | Admit: 2023-05-25 | Discharge: 2023-05-25 | Disposition: A | Discharge status: Home / Self Care | Source: Ambulatory Visit | Attending: Physician Assistant | Admitting: Physician Assistant

## 2023-05-25 VITALS — BP 142/80 | HR 68 | Ht 73.0 in | Wt 228.8 lb

## 2023-05-25 DIAGNOSIS — I480 Paroxysmal atrial fibrillation: Secondary | ICD-10-CM

## 2023-05-25 DIAGNOSIS — D6869 Other thrombophilia: Secondary | ICD-10-CM | POA: Diagnosis not present

## 2023-05-25 NOTE — Progress Notes (Signed)
 Primary Care Physician: Orest Bio, MD Primary Electrophysiologist: none Referring Physician: Dr Ona Bidding is a 73 y.o. male with a history of HTN, DM, CAD, atrial fibrillation, atrial flutter who presents for follow up in the [facility]. Patient is s/p ablation in 2019 x 2.    Patient returns for follow up for atrial fibrillation. He denies any interim symptoms of afib. He remains in SR. No bleeding issues on anticoagulation. His primary issues are regarding his peripheral neuropathy.   Today, he  denies symptoms of palpitations, chest pain, shortness of breath, orthopnea, PND, lower extremity edema, dizziness, presyncope, syncope, snoring, daytime somnolence, bleeding, or neurologic sequela. The patient is tolerating medications without difficulties and is otherwise without complaint today.    Atrial Fibrillation Risk Factors:  he does not have symptoms or diagnosis of sleep apnea. he does not have a history of alcohol  use. The patient does not have a history of early familial atrial fibrillation or other arrhythmias.   Atrial Fibrillation Management history:  Previous antiarrhythmic drugs: flecainide  Previous cardioversions: 08/11/17 Previous ablations: 02/2017, 10/2017 Anticoagulation history: Eliquis     Past Medical History:  Diagnosis Date   Atrial fibrillation (HCC)    Diabetic neuropathy (HCC)    Dyslipidemia    Ejection fraction    EF 55-60%, echo, May, 2014   Heart murmur    As a Child   Hypertension    Overweight(278.02)    Peripheral neuropathy    Type II diabetes mellitus (HCC)     Current Outpatient Medications  Medication Sig Dispense Refill   acetaminophen  (TYLENOL ) 500 MG tablet Take 500-1,000 mg by mouth every 6 (six) hours as needed for moderate pain.     amLODipine  (NORVASC ) 10 MG tablet TAKE 1 TABLET DAILY 90 tablet 1   apixaban  (ELIQUIS ) 5 MG TABS tablet Take 1 tablet (5 mg total) by mouth 2 (two)  times daily. 180 tablet 3   atorvastatin (LIPITOR) 10 MG tablet Take 5 mg by mouth every Monday, Wednesday, and Friday.      Azelastine HCl 137 MCG/SPRAY SOLN Place 1 spray into both nostrils daily as needed (allergies).     Blood Pressure Monitoring (BLOOD PRESSURE MONITOR AUTOMAT) DEVI 1 each by Does not apply route as directed. Large cuff digital Blood Pressure Monitor Dx: hypertensive heart disease 1 each 0   cetirizine (ZYRTEC) 10 MG tablet Take 5-10 mg by mouth daily as needed for allergies.      cyclobenzaprine  (FLEXERIL ) 10 MG tablet Take 10 mg by mouth daily as needed for muscle spasms.     fluticasone (FLONASE) 50 MCG/ACT nasal spray Place 2 sprays into both nostrils daily as needed for allergies.     gabapentin  (NEURONTIN ) 300 MG capsule Take 300-600 mg by mouth See admin instructions. 300 mg in the evening, 600 mg at bedtime     lisinopril -hydrochlorothiazide  (PRINZIDE ,ZESTORETIC ) 20-12.5 MG per tablet Take 2 tablets by mouth daily.      meclizine (ANTIVERT) 25 MG tablet SMARTSIG:0.5-1 Tablet(s) By Mouth Every 6 Hours     metFORMIN (GLUCOPHAGE-XR) 500 MG 24 hr tablet Take 1,000 mg by mouth 2 (two) times daily.   1   metoprolol  succinate (TOPROL -XL) 50 MG 24 hr tablet TAKE 1 TABLET DAILY (DOSE  CHANGE 08/09/17) 30 tablet 0   pantoprazole  (PROTONIX ) 40 MG tablet Take 1 tablet by mouth twice daily 180 tablet 0   No current facility-administered medications for this encounter.    ROS-  All systems are reviewed and negative except as per the HPI above.  Physical Exam: Vitals:   05/25/23 1336  BP: (!) 142/80  Pulse: 68  Weight: 103.8 kg  Height: 6\' 1"  (1.854 m)     GEN: Well nourished, well developed in no acute distress CARDIAC: Regular rate and rhythm, no murmurs, rubs, gallops RESPIRATORY:  Clear to auscultation without rales, wheezing or rhonchi  ABDOMEN: Soft, non-tender, non-distended EXTREMITIES:  No edema; No deformity    Wt Readings from Last 3 Encounters:  05/25/23  103.8 kg  05/23/22 102.2 kg  05/20/21 96.8 kg    EKG today demonstrates SR Vent. rate 68 BPM PR interval 166 ms QRS duration 80 ms QT/QTcB 370/393 ms   Echo 06/23/16 demonstrated  - Left ventricle: The cavity size was normal. Wall thickness was   increased in a pattern of mild LVH. Systolic function was normal.   The estimated ejection fraction was in the range of 60% to 65%.   Left ventricular diastolic function parameters were normal. - Aortic valve: AV is thickened with minimally restricted motion. - Mitral valve: There was mild regurgitation.  Epic records are reviewed at length today   CHA2DS2-VASc Score = 4  The patient's score is based upon: CHF History: 0 HTN History: 1 Diabetes History: 1 Stroke History: 0 Vascular Disease History: 1 Age Score: 1 Gender Score: 0       ASSESSMENT AND PLAN: Paroxysmal Atrial Fibrillation/atrial flutter The patient's CHA2DS2-VASc score is 4, indicating a 4.8% annual risk of stroke.   S/p afib ablation 02/2017 and 10/2017 Patient appears to be maintaining SR Continue Eliquis  5 mg BID Continue Toprol  50 mg daily  Secondary Hypercoagulable State (ICD10:  D68.69) The patient is at significant risk for stroke/thromboembolism based upon his CHA2DS2-VASc Score of 4.  Continue Apixaban  (Eliquis ). No bleeding issues.   HTN Stable on current regimen  CAD CAC score 496 on CT No anginal symptoms    Follow up in the AF clinic as needed. Will refer him to establish care with a primary cardiologist at the [facility] office.    Myrtha Ates PA-C [facility] [address] [place], [place] [address] [phone]  05/25/2023 1:53 PM

## 2023-06-15 DIAGNOSIS — N1831 Chronic kidney disease, stage 3a: Secondary | ICD-10-CM | POA: Diagnosis not present

## 2023-06-15 DIAGNOSIS — E1121 Type 2 diabetes mellitus with diabetic nephropathy: Secondary | ICD-10-CM | POA: Diagnosis not present

## 2023-06-15 DIAGNOSIS — E7849 Other hyperlipidemia: Secondary | ICD-10-CM | POA: Diagnosis not present

## 2023-06-15 DIAGNOSIS — E7801 Familial hypercholesterolemia: Secondary | ICD-10-CM | POA: Diagnosis not present

## 2023-06-15 DIAGNOSIS — I10 Essential (primary) hypertension: Secondary | ICD-10-CM | POA: Diagnosis not present

## 2023-06-22 DIAGNOSIS — N1831 Chronic kidney disease, stage 3a: Secondary | ICD-10-CM | POA: Diagnosis not present

## 2023-06-22 DIAGNOSIS — E782 Mixed hyperlipidemia: Secondary | ICD-10-CM | POA: Diagnosis not present

## 2023-06-22 DIAGNOSIS — E6609 Other obesity due to excess calories: Secondary | ICD-10-CM | POA: Diagnosis not present

## 2023-06-22 DIAGNOSIS — I10 Essential (primary) hypertension: Secondary | ICD-10-CM | POA: Diagnosis not present

## 2023-06-22 DIAGNOSIS — Z6829 Body mass index (BMI) 29.0-29.9, adult: Secondary | ICD-10-CM | POA: Diagnosis not present

## 2023-08-18 DIAGNOSIS — H40011 Open angle with borderline findings, low risk, right eye: Secondary | ICD-10-CM | POA: Diagnosis not present

## 2023-08-22 DIAGNOSIS — Z23 Encounter for immunization: Secondary | ICD-10-CM | POA: Diagnosis not present

## 2023-09-19 DIAGNOSIS — Z23 Encounter for immunization: Secondary | ICD-10-CM | POA: Diagnosis not present

## 2023-10-16 DIAGNOSIS — E7849 Other hyperlipidemia: Secondary | ICD-10-CM | POA: Diagnosis not present

## 2023-10-16 DIAGNOSIS — E1022 Type 1 diabetes mellitus with diabetic chronic kidney disease: Secondary | ICD-10-CM | POA: Diagnosis not present

## 2023-10-16 DIAGNOSIS — N1831 Chronic kidney disease, stage 3a: Secondary | ICD-10-CM | POA: Diagnosis not present

## 2023-10-16 DIAGNOSIS — I10 Essential (primary) hypertension: Secondary | ICD-10-CM | POA: Diagnosis not present

## 2023-10-16 DIAGNOSIS — R42 Dizziness and giddiness: Secondary | ICD-10-CM | POA: Diagnosis not present

## 2023-10-23 DIAGNOSIS — E1165 Type 2 diabetes mellitus with hyperglycemia: Secondary | ICD-10-CM | POA: Diagnosis not present

## 2023-10-23 DIAGNOSIS — N1831 Chronic kidney disease, stage 3a: Secondary | ICD-10-CM | POA: Diagnosis not present

## 2023-10-23 DIAGNOSIS — Z23 Encounter for immunization: Secondary | ICD-10-CM | POA: Diagnosis not present

## 2023-10-23 DIAGNOSIS — E1142 Type 2 diabetes mellitus with diabetic polyneuropathy: Secondary | ICD-10-CM | POA: Diagnosis not present

## 2023-10-23 DIAGNOSIS — Z6829 Body mass index (BMI) 29.0-29.9, adult: Secondary | ICD-10-CM | POA: Diagnosis not present

## 2023-10-23 DIAGNOSIS — I482 Chronic atrial fibrillation, unspecified: Secondary | ICD-10-CM | POA: Diagnosis not present

## 2023-10-23 DIAGNOSIS — G4700 Insomnia, unspecified: Secondary | ICD-10-CM | POA: Diagnosis not present

## 2023-10-23 DIAGNOSIS — E6609 Other obesity due to excess calories: Secondary | ICD-10-CM | POA: Diagnosis not present

## 2023-10-23 DIAGNOSIS — D229 Melanocytic nevi, unspecified: Secondary | ICD-10-CM | POA: Diagnosis not present

## 2023-10-23 DIAGNOSIS — I10 Essential (primary) hypertension: Secondary | ICD-10-CM | POA: Diagnosis not present
